# Patient Record
Sex: Female | Born: 1990 | Race: White | Hispanic: No | Marital: Single | Smoking: Never smoker
Health system: Southern US, Community
[De-identification: ages and names within clinical notes are randomized; demographics above are authoritative.]

## PROBLEM LIST (undated history)

## (undated) DIAGNOSIS — Z789 Other specified health status: Secondary | ICD-10-CM

## (undated) HISTORY — PX: NO PAST SURGERIES: SHX2092

---
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
## 2014-12-04 ENCOUNTER — Ambulatory Visit (INDEPENDENT_AMBULATORY_CARE_PROVIDER_SITE_OTHER): Payer: BC Managed Care – PPO | Admitting: Obstetrics and Gynecology

## 2014-12-04 ENCOUNTER — Encounter: Payer: Self-pay | Admitting: Obstetrics and Gynecology

## 2014-12-04 VITALS — BP 116/69 | HR 80 | Ht 65.0 in | Wt 162.1 lb

## 2014-12-04 DIAGNOSIS — N926 Irregular menstruation, unspecified: Secondary | ICD-10-CM

## 2014-12-04 DIAGNOSIS — E663 Overweight: Secondary | ICD-10-CM

## 2014-12-04 LAB — POCT URINE PREGNANCY: PREG TEST UR: POSITIVE — AB

## 2014-12-04 NOTE — Progress Notes (Signed)
Subjective:     Patient ID: Erica Gibson, female   DOB: 02/05/1991, 24 y.o.   MRN: [id number]  HPI Here for pregnancy confirmation  Review of Systems Missed menses with LMP 10/05/14, EGA [redacted]w[redacted]d, with EDC 07/12/15, denies nausea, just feeling tired.    Objective:   Physical Exam A&O x4 Well groomed female in no distress  PE not indicated.     Assessment:     Early pregnancy Overweight     Plan:     Viability scan & Nurse intake within next week and then to see me at 11 weeks for NOB PE  Melody Ines Bloomer, CNM

## 2014-12-05 ENCOUNTER — Other Ambulatory Visit: Payer: BC Managed Care – PPO

## 2014-12-05 DIAGNOSIS — N926 Irregular menstruation, unspecified: Secondary | ICD-10-CM

## 2014-12-05 DIAGNOSIS — Z3491 Encounter for supervision of normal pregnancy, unspecified, first trimester: Secondary | ICD-10-CM

## 2014-12-05 LAB — OB RESULTS CONSOLE GC/CHLAMYDIA
CHLAMYDIA, DNA PROBE: NEGATIVE
Gonorrhea: NEGATIVE

## 2014-12-05 LAB — OB RESULTS CONSOLE RUBELLA ANTIBODY, IGM: RUBELLA: IMMUNE

## 2014-12-05 LAB — OB RESULTS CONSOLE VARICELLA ZOSTER ANTIBODY, IGG: VARICELLA IGG: IMMUNE

## 2014-12-05 LAB — OB RESULTS CONSOLE ABO/RH: RH TYPE: POSITIVE

## 2014-12-05 LAB — OB RESULTS CONSOLE HIV ANTIBODY (ROUTINE TESTING): HIV: NONREACTIVE

## 2014-12-06 LAB — VARICELLA ZOSTER ANTIBODY, IGG: VARICELLA: 234 {index} (ref >165)

## 2014-12-06 LAB — CBC WITH DIFFERENTIAL/PLATELET
Basophils Absolute: 0 10*3/uL (ref 0.0–0.2)
Basos: 0 %
EOS (ABSOLUTE): 0.1 10*3/uL (ref 0.0–0.4)
EOS: 1 %
HEMOGLOBIN: 13 g/dL (ref 11.1–15.9)
Hematocrit: 38.3 % (ref 34.0–46.6)
Immature Grans (Abs): 0 10*3/uL (ref 0.0–0.1)
Immature Granulocytes: 0 %
LYMPHS: 33 %
Lymphocytes Absolute: 1.8 10*3/uL (ref 0.7–3.1)
MCH: 30.5 pg (ref 26.6–33.0)
MCHC: 33.9 g/dL (ref 31.5–35.7)
MCV: 90 fL (ref 79–97)
MONOCYTES: 7 %
MONOS ABS: 0.4 10*3/uL (ref 0.1–0.9)
Neutrophils Absolute: 3.3 10*3/uL (ref 1.4–7.0)
Neutrophils: 59 %
PLATELETS: 198 10*3/uL (ref 150–379)
RBC: 4.26 x10E6/uL (ref 3.77–5.28)
RDW: 12.8 % (ref 12.3–15.4)
WBC: 5.5 10*3/uL (ref 3.4–10.8)

## 2014-12-06 LAB — ANTIBODY SCREEN: Antibody Screen: NEGATIVE

## 2014-12-06 LAB — HEP, RPR, HIV PANEL
HEP B S AG: NEGATIVE
HIV Screen 4th Generation wRfx: NONREACTIVE
RPR: NONREACTIVE

## 2014-12-06 LAB — RUBELLA SCREEN: Rubella Antibodies, IGG: 3.88 index (ref >0.99)

## 2014-12-06 LAB — ABO AND RH: RH TYPE: POSITIVE

## 2014-12-09 ENCOUNTER — Other Ambulatory Visit: Payer: BC Managed Care – PPO

## 2014-12-25 ENCOUNTER — Encounter: Payer: Self-pay | Admitting: Obstetrics and Gynecology

## 2014-12-25 ENCOUNTER — Ambulatory Visit (INDEPENDENT_AMBULATORY_CARE_PROVIDER_SITE_OTHER): Payer: BC Managed Care – PPO | Admitting: Obstetrics and Gynecology

## 2014-12-25 VITALS — BP 115/74 | HR 112 | Wt 161.5 lb

## 2014-12-25 DIAGNOSIS — Z331 Pregnant state, incidental: Secondary | ICD-10-CM

## 2014-12-25 LAB — POCT URINALYSIS DIPSTICK
Blood, UA: NEGATIVE
GLUCOSE UA: NEGATIVE
KETONES UA: NEGATIVE
LEUKOCYTES UA: NEGATIVE
NITRITE UA: NEGATIVE
Spec Grav, UA: 1.015
Urobilinogen, UA: 0.2
pH, UA: 6.5

## 2014-12-25 NOTE — Patient Instructions (Signed)
Second Trimester of Pregnancy The second trimester is from week 13 through week 28, months 4 through 6. The second trimester is often a time when you feel your best. Your body has also adjusted to being pregnant, and you begin to feel better physically. Usually, morning sickness has lessened or quit completely, you may have more energy, and you may have an increase in appetite. The second trimester is also a time when the fetus is growing rapidly. At the end of the sixth month, the fetus is about 9 inches long and weighs about 1 pounds. You will likely begin to feel the baby move (quickening) between 18 and 20 weeks of the pregnancy. BODY CHANGES Your body goes through many changes during pregnancy. The changes vary from woman to woman.   Your weight will continue to increase. You will notice your lower abdomen bulging out.  You may begin to get stretch marks on your hips, abdomen, and breasts.  You may develop headaches that can be relieved by medicines approved by your health care provider.  You may urinate more often because the fetus is pressing on your bladder.  You may develop or continue to have heartburn as a result of your pregnancy.  You may develop constipation because certain hormones are causing the muscles that push waste through your intestines to slow down.  You may develop hemorrhoids or swollen, bulging veins (varicose veins).  You may have back pain because of the weight gain and pregnancy hormones relaxing your joints between the bones in your pelvis and as a result of a shift in weight and the muscles that support your balance.  Your breasts will continue to grow and be tender.  Your gums may bleed and may be sensitive to brushing and flossing.  Dark spots or blotches (chloasma, mask of pregnancy) may develop on your face. This will likely fade after the baby is born.  A dark line from your belly button to the pubic area (linea nigra) may appear. This will likely fade  after the baby is born.  You may have changes in your hair. These can include thickening of your hair, rapid growth, and changes in texture. Some women also have hair loss during or after pregnancy, or hair that feels dry or thin. Your hair will most likely return to normal after your baby is born. WHAT TO EXPECT AT YOUR PRENATAL VISITS During a routine prenatal visit:  You will be weighed to make sure you and the fetus are growing normally.  Your blood pressure will be taken.  Your abdomen will be measured to track your baby's growth.  The fetal heartbeat will be listened to.  Any test results from the previous visit will be discussed. Your health care provider may ask you:  How you are feeling.  If you are feeling the baby move.  If you have had any abnormal symptoms, such as leaking fluid, bleeding, severe headaches, or abdominal cramping.  If you have any questions. Other tests that may be performed during your second trimester include:  Blood tests that check for:  Low iron levels (anemia).  Gestational diabetes (between 24 and 28 weeks).  Rh antibodies.  Urine tests to check for infections, diabetes, or protein in the urine.  An ultrasound to confirm the proper growth and development of the baby.  An amniocentesis to check for possible genetic problems.  Fetal screens for spina bifida and Down syndrome. HOME CARE INSTRUCTIONS   Avoid all smoking, herbs, alcohol, and unprescribed   drugs. These chemicals affect the formation and growth of the baby.  Follow your health care provider's instructions regarding medicine use. There are medicines that are either safe or unsafe to take during pregnancy.  Exercise only as directed by your health care provider. Experiencing uterine cramps is a good sign to stop exercising.  Continue to eat regular, healthy meals.  Wear a good support bra for breast tenderness.  Do not use hot tubs, steam rooms, or saunas.  Wear your  seat belt at all times when driving.  Avoid raw meat, uncooked cheese, cat litter boxes, and soil used by cats. These carry germs that can cause birth defects in the baby.  Take your prenatal vitamins.  Try taking a stool softener (if your health care provider approves) if you develop constipation. Eat more high-fiber foods, such as fresh vegetables or fruit and whole grains. Drink plenty of fluids to keep your urine clear or pale yellow.  Take warm sitz baths to soothe any pain or discomfort caused by hemorrhoids. Use hemorrhoid cream if your health care provider approves.  If you develop varicose veins, wear support hose. Elevate your feet for 15 minutes, 3-4 times a day. Limit salt in your diet.  Avoid heavy lifting, wear low heel shoes, and practice good posture.  Rest with your legs elevated if you have leg cramps or low back pain.  Visit your dentist if you have not gone yet during your pregnancy. Use a soft toothbrush to brush your teeth and be gentle when you floss.  A sexual relationship may be continued unless your health care provider directs you otherwise.  Continue to go to all your prenatal visits as directed by your health care provider. SEEK MEDICAL CARE IF:   You have dizziness.  You have mild pelvic cramps, pelvic pressure, or nagging pain in the abdominal area.  You have persistent nausea, vomiting, or diarrhea.  You have a bad smelling vaginal discharge.  You have pain with urination. SEEK IMMEDIATE MEDICAL CARE IF:   You have a fever.  You are leaking fluid from your vagina.  You have spotting or bleeding from your vagina.  You have severe abdominal cramping or pain.  You have rapid weight gain or loss.  You have shortness of breath with chest pain.  You notice sudden or extreme swelling of your face, hands, ankles, feet, or legs.  You have not felt your baby move in over an hour.  You have severe headaches that do not go away with  medicine.  You have vision changes. Document Released: 04/05/2001 Document Revised: 04/16/2013 Document Reviewed: 06/12/2012 ExitCare Patient Information 2015 [employer]. This information is not intended to replace advice given to you by your health care provider. Make sure you discuss any questions you have with your health care provider.  

## 2014-12-25 NOTE — Progress Notes (Signed)
NEW OB HISTORY AND PHYSICAL  SUBJECTIVE:       Erica Gibson is a 24 y.o. G1P0 female, Patient's last menstrual period was 10/05/2014 (exact date)., Estimated Date of Delivery: 07/12/15, [redacted]w[redacted]d, presents today for establishment of Prenatal Care. She has no unusual complaints and complains of fatigue      Gynecologic History Patient's last menstrual period was 10/05/2014 (exact date). Normal Contraception: none Last Pap: 2015. Results were: normal  Obstetric History OB History  Gravida Para Term Preterm AB SAB TAB Ectopic Multiple Living  1             # Outcome Date GA Lbr Len/2nd Weight Sex Delivery Anes PTL Lv  1 Current               History reviewed. No pertinent past medical history.  History reviewed. No pertinent past surgical history.  Current Outpatient Prescriptions on File Prior to Visit  Medication Sig Dispense Refill  . Prenatal Vit-Fe Fumarate-FA (PRENATAL MULTIVITAMIN) TABS tablet Take 1 tablet by mouth daily at 12 noon.     No current facility-administered medications on file prior to visit.    No Known Allergies  Social History   Social History  . Marital Status: Single    Spouse Name: N/A  . Number of Children: N/A  . Years of Education: N/A   Occupational History  . Not on file.   Social History Main Topics  . Smoking status: Never Smoker   . Smokeless tobacco: Never Used  . Alcohol Use: No  . Drug Use: No  . Sexual Activity: Yes    Birth Control/ Protection: None   Other Topics Concern  . Not on file   Social History Narrative    Family History  Problem Relation Age of Onset  . Hyperlipidemia Father     The following portions of the patient's history were reviewed and updated as appropriate: allergies, current medications, past OB history, past medical history, past surgical history, past family history, past social history, and problem list.    OBJECTIVE: Initial Physical Exam (New OB)  GENERAL APPEARANCE: alert, well  appearing, in no apparent distress, oriented to person, place and time HEAD: normocephalic, atraumatic MOUTH: mucous membranes moist, pharynx normal without lesions THYROID: no thyromegaly or masses present BREASTS: no masses noted, no significant tenderness, no palpable axillary nodes, no skin changes LUNGS: clear to auscultation, no wheezes, rales or rhonchi, symmetric air entry HEART: regular rate and rhythm, no murmurs ABDOMEN: soft, nontender, nondistended, no abnormal masses, no epigastric pain EXTREMITIES: no redness or tenderness in the calves or thighs SKIN: normal coloration and turgor, no rashes LYMPH NODES: no adenopathy palpable NEUROLOGIC: alert, oriented, normal speech, no focal findings or movement disorder noted  PELVIC EXAM FH 12 weeks, fundus + FHR  ASSESSMENT: Normal pregnancy  PLAN: Prenatal care See orders [facility] obtained

## 2014-12-25 NOTE — Progress Notes (Signed)
NOB-c/o acne on her forehead, no other complaints

## 2014-12-31 ENCOUNTER — Encounter: Payer: Self-pay | Admitting: Obstetrics and Gynecology

## 2015-01-01 ENCOUNTER — Other Ambulatory Visit: Payer: Self-pay | Admitting: Obstetrics and Gynecology

## 2015-01-09 ENCOUNTER — Encounter: Payer: Self-pay | Admitting: Obstetrics and Gynecology

## 2015-01-20 ENCOUNTER — Encounter: Payer: Self-pay | Admitting: Obstetrics and Gynecology

## 2015-01-20 ENCOUNTER — Ambulatory Visit (INDEPENDENT_AMBULATORY_CARE_PROVIDER_SITE_OTHER): Payer: BC Managed Care – PPO | Admitting: Obstetrics and Gynecology

## 2015-01-20 VITALS — BP 117/72 | HR 105 | Wt 169.5 lb

## 2015-01-20 DIAGNOSIS — Z3492 Encounter for supervision of normal pregnancy, unspecified, second trimester: Secondary | ICD-10-CM

## 2015-01-20 LAB — POCT URINALYSIS DIPSTICK
Bilirubin, UA: NEGATIVE
GLUCOSE UA: NEGATIVE
Ketones, UA: NEGATIVE
NITRITE UA: NEGATIVE
PH UA: 6.5
PROTEIN UA: NEGATIVE
RBC UA: NEGATIVE
Spec Grav, UA: 1.01
UROBILINOGEN UA: 0.2

## 2015-01-20 MED ORDER — INFLUENZA VAC SPLIT QUAD 0.5 ML IM SUSY
0.5000 mL | PREFILLED_SYRINGE | Freq: Once | INTRAMUSCULAR | Status: AC
  Administered 2015-01-20: 0.5 mL via INTRAMUSCULAR

## 2015-01-20 NOTE — Progress Notes (Signed)
ROB- doing well, anatomy scan next visit 

## 2015-01-20 NOTE — Progress Notes (Signed)
ROB- c/o acne on her face, states its not getting any better

## 2015-01-20 NOTE — Patient Instructions (Signed)
Second Trimester of Pregnancy The second trimester is from week 13 through week 28, months 4 through 6. The second trimester is often a time when you feel your best. Your body has also adjusted to being pregnant, and you begin to feel better physically. Usually, morning sickness has lessened or quit completely, you may have more energy, and you may have an increase in appetite. The second trimester is also a time when the fetus is growing rapidly. At the end of the sixth month, the fetus is about 9 inches long and weighs about 1 pounds. You will likely begin to feel the baby move (quickening) between 18 and 20 weeks of the pregnancy. BODY CHANGES Your body goes through many changes during pregnancy. The changes vary from woman to woman.   Your weight will continue to increase. You will notice your lower abdomen bulging out.  You may begin to get stretch marks on your hips, abdomen, and breasts.  You may develop headaches that can be relieved by medicines approved by your health care provider.  You may urinate more often because the fetus is pressing on your bladder.  You may develop or continue to have heartburn as a result of your pregnancy.  You may develop constipation because certain hormones are causing the muscles that push waste through your intestines to slow down.  You may develop hemorrhoids or swollen, bulging veins (varicose veins).  You may have back pain because of the weight gain and pregnancy hormones relaxing your joints between the bones in your pelvis and as a result of a shift in weight and the muscles that support your balance.  Your breasts will continue to grow and be tender.  Your gums may bleed and may be sensitive to brushing and flossing.  Dark spots or blotches (chloasma, mask of pregnancy) may develop on your face. This will likely fade after the baby is born.  A dark line from your belly button to the pubic area (linea nigra) may appear. This will likely fade  after the baby is born.  You may have changes in your hair. These can include thickening of your hair, rapid growth, and changes in texture. Some women also have hair loss during or after pregnancy, or hair that feels dry or thin. Your hair will most likely return to normal after your baby is born. WHAT TO EXPECT AT YOUR PRENATAL VISITS During a routine prenatal visit:  You will be weighed to make sure you and the fetus are growing normally.  Your blood pressure will be taken.  Your abdomen will be measured to track your baby's growth.  The fetal heartbeat will be listened to.  Any test results from the previous visit will be discussed. Your health care provider may ask you:  How you are feeling.  If you are feeling the baby move.  If you have had any abnormal symptoms, such as leaking fluid, bleeding, severe headaches, or abdominal cramping.  If you have any questions. Other tests that may be performed during your second trimester include:  Blood tests that check for:  Low iron levels (anemia).  Gestational diabetes (between 24 and 28 weeks).  Rh antibodies.  Urine tests to check for infections, diabetes, or protein in the urine.  An ultrasound to confirm the proper growth and development of the baby.  An amniocentesis to check for possible genetic problems.  Fetal screens for spina bifida and Down syndrome. HOME CARE INSTRUCTIONS   Avoid all smoking, herbs, alcohol, and unprescribed   drugs. These chemicals affect the formation and growth of the baby.  Follow your health care provider's instructions regarding medicine use. There are medicines that are either safe or unsafe to take during pregnancy.  Exercise only as directed by your health care provider. Experiencing uterine cramps is a good sign to stop exercising.  Continue to eat regular, healthy meals.  Wear a good support bra for breast tenderness.  Do not use hot tubs, steam rooms, or saunas.  Wear your  seat belt at all times when driving.  Avoid raw meat, uncooked cheese, cat litter boxes, and soil used by cats. These carry germs that can cause birth defects in the baby.  Take your prenatal vitamins.  Try taking a stool softener (if your health care provider approves) if you develop constipation. Eat more high-fiber foods, such as fresh vegetables or fruit and whole grains. Drink plenty of fluids to keep your urine clear or pale yellow.  Take warm sitz baths to soothe any pain or discomfort caused by hemorrhoids. Use hemorrhoid cream if your health care provider approves.  If you develop varicose veins, wear support hose. Elevate your feet for 15 minutes, 3-4 times a day. Limit salt in your diet.  Avoid heavy lifting, wear low heel shoes, and practice good posture.  Rest with your legs elevated if you have leg cramps or low back pain.  Visit your dentist if you have not gone yet during your pregnancy. Use a soft toothbrush to brush your teeth and be gentle when you floss.  A sexual relationship may be continued unless your health care provider directs you otherwise.  Continue to go to all your prenatal visits as directed by your health care provider. SEEK MEDICAL CARE IF:   You have dizziness.  You have mild pelvic cramps, pelvic pressure, or nagging pain in the abdominal area.  You have persistent nausea, vomiting, or diarrhea.  You have a bad smelling vaginal discharge.  You have pain with urination. SEEK IMMEDIATE MEDICAL CARE IF:   You have a fever.  You are leaking fluid from your vagina.  You have spotting or bleeding from your vagina.  You have severe abdominal cramping or pain.  You have rapid weight gain or loss.  You have shortness of breath with chest pain.  You notice sudden or extreme swelling of your face, hands, ankles, feet, or legs.  You have not felt your baby move in over an hour.  You have severe headaches that do not go away with  medicine.  You have vision changes. Document Released: 04/05/2001 Document Revised: 04/16/2013 Document Reviewed: 06/12/2012 ExitCare Patient Information 2015 [employer]. This information is not intended to replace advice given to you by your health care provider. Make sure you discuss any questions you have with your health care provider.  

## 2015-02-20 ENCOUNTER — Ambulatory Visit (INDEPENDENT_AMBULATORY_CARE_PROVIDER_SITE_OTHER): Payer: BC Managed Care – PPO | Admitting: Obstetrics and Gynecology

## 2015-02-20 ENCOUNTER — Encounter: Payer: Self-pay | Admitting: Obstetrics and Gynecology

## 2015-02-20 ENCOUNTER — Ambulatory Visit: Payer: BC Managed Care – PPO

## 2015-02-20 VITALS — BP 100/62 | HR 95 | Wt 178.3 lb

## 2015-02-20 DIAGNOSIS — Z3492 Encounter for supervision of normal pregnancy, unspecified, second trimester: Secondary | ICD-10-CM

## 2015-02-20 LAB — POCT URINALYSIS DIPSTICK
BILIRUBIN UA: NEGATIVE
GLUCOSE UA: NEGATIVE
Ketones, UA: NEGATIVE
LEUKOCYTES UA: NEGATIVE
NITRITE UA: NEGATIVE
PH UA: 6
PROTEIN UA: NEGATIVE
RBC UA: NEGATIVE
Spec Grav, UA: 1.01
UROBILINOGEN UA: 0.2

## 2015-02-20 NOTE — Progress Notes (Signed)
ROB- pt is feeling good, no complaints 

## 2015-02-20 NOTE — Progress Notes (Signed)
Indications:  Anatomy Findings:  Singleton intrauterine pregnancy is visualized with FHR at 153 BPM. Biometrics give an (U/S) Gestational age of [redacted] weeks and 3 days, and an (U/S) EDD of 07/14/15; this correlates with the clinically established EDD of 07/12/15.  Fetal presentation is breech, spine anterior.  EFW: 303 grams ( 0 lbs. 11 oz. ). Placenta: Posterior, grade 1 and remote to cervix. AFI: Adequate, MVP 3.8 cm.   Anatomic survey is complete and appears  normal; Gender - female .   Right and left ovary were not visualized.  There is no evidence of a corpus luteal cyst. Survey of the adnexa demonstrates no adnexal masses. There is no free peritoneal fluid in the cul de sac.  Impression: 1. 19 week 3 day Viable Singleton Intrauterine pregnancy by U/S. 2. (U/S) EDD is consistent with Clinically established (LMP) EDD of 07/12/15. 3. Normal Anatomy Scan

## 2015-03-25 ENCOUNTER — Ambulatory Visit (INDEPENDENT_AMBULATORY_CARE_PROVIDER_SITE_OTHER): Payer: BC Managed Care – PPO | Admitting: Obstetrics and Gynecology

## 2015-03-25 ENCOUNTER — Encounter: Payer: Self-pay | Admitting: Obstetrics and Gynecology

## 2015-03-25 VITALS — BP 114/75 | HR 89 | Wt 177.7 lb

## 2015-03-25 DIAGNOSIS — Z3493 Encounter for supervision of normal pregnancy, unspecified, third trimester: Secondary | ICD-10-CM

## 2015-03-25 LAB — POCT URINALYSIS DIPSTICK
Bilirubin, UA: NEGATIVE
Glucose, UA: NEGATIVE
Ketones, UA: NEGATIVE
Nitrite, UA: NEGATIVE
Protein, UA: NEGATIVE
RBC UA: NEGATIVE
SPEC GRAV UA: 1.015
UROBILINOGEN UA: 0.2
pH, UA: 7

## 2015-03-25 NOTE — Progress Notes (Signed)
ROB-pt denies any complaints 

## 2015-03-25 NOTE — Progress Notes (Signed)
ROB- doing well, encouraged enrolling in CBC, ICC, & BFC- schedule given, glucola next visit.

## 2015-04-21 ENCOUNTER — Other Ambulatory Visit: Payer: Self-pay | Admitting: *Deleted

## 2015-04-21 ENCOUNTER — Encounter: Payer: Self-pay | Admitting: Obstetrics and Gynecology

## 2015-04-21 ENCOUNTER — Ambulatory Visit (INDEPENDENT_AMBULATORY_CARE_PROVIDER_SITE_OTHER): Payer: BC Managed Care – PPO | Admitting: Obstetrics and Gynecology

## 2015-04-21 VITALS — BP 99/70 | HR 100 | Wt 180.4 lb

## 2015-04-21 DIAGNOSIS — Z3493 Encounter for supervision of normal pregnancy, unspecified, third trimester: Secondary | ICD-10-CM

## 2015-04-21 DIAGNOSIS — Z23 Encounter for immunization: Secondary | ICD-10-CM

## 2015-04-21 DIAGNOSIS — Z131 Encounter for screening for diabetes mellitus: Secondary | ICD-10-CM

## 2015-04-21 LAB — POCT URINALYSIS DIPSTICK
BILIRUBIN UA: NEGATIVE
Blood, UA: NEGATIVE
GLUCOSE UA: NEGATIVE
KETONES UA: 40
Nitrite, UA: NEGATIVE
SPEC GRAV UA: 1.015
Urobilinogen, UA: 0.2
pH, UA: 6

## 2015-04-21 MED ORDER — TETANUS-DIPHTH-ACELL PERTUSSIS 5-2.5-18.5 LF-MCG/0.5 IM SUSP
0.5000 mL | Freq: Once | INTRAMUSCULAR | Status: AC
  Administered 2015-04-21: 0.5 mL via INTRAMUSCULAR

## 2015-04-21 NOTE — Progress Notes (Signed)
ROB & glucola-enrolled in classes, no concerns, blood consent signed,

## 2015-04-21 NOTE — Patient Instructions (Signed)

## 2015-04-21 NOTE — Progress Notes (Signed)
ROB-blood consent signed, glucola done, tdap given States she is having some nasal congestion, hasnt used anything OTC

## 2015-04-22 ENCOUNTER — Telehealth: Payer: Self-pay | Admitting: Obstetrics and Gynecology

## 2015-04-22 ENCOUNTER — Other Ambulatory Visit: Payer: Self-pay | Admitting: Obstetrics and Gynecology

## 2015-04-22 DIAGNOSIS — O99810 Abnormal glucose complicating pregnancy: Secondary | ICD-10-CM

## 2015-04-22 LAB — GLUCOSE, 1 HOUR GESTATIONAL: Gestational Diabetes Screen: 158 mg/dL — ABNORMAL HIGH (ref 65–139)

## 2015-04-22 LAB — HEMOGLOBIN AND HEMATOCRIT, BLOOD
HEMATOCRIT: 35.3 % (ref 34.0–46.6)
Hemoglobin: 12 g/dL (ref 11.1–15.9)

## 2015-04-22 NOTE — Telephone Encounter (Signed)
Notified pt of results, she is going to call us 04/23/15 and let us know

## 2015-04-22 NOTE — Telephone Encounter (Signed)
PT CALLED AND SHE WAS HERE YESTERDAY FOR HER GLUCOSE AND SHE WANTED TO KNOW THE RESULTS IF THEY WERE BACK.

## 2015-04-22 NOTE — Telephone Encounter (Signed)
Notified pt of labs she will contact us about 3 hr

## 2015-04-22 NOTE — Telephone Encounter (Signed)
-----   Message from [place]res[place]ody N [place], [place] sent at 04/22/2015  8:47 AM EST ----- Please let her know she failed her 1h GTT, needs to come in within the next week for the three hour fasting. No signs of anemia

## 2015-04-24 ENCOUNTER — Other Ambulatory Visit: Payer: Self-pay | Admitting: Obstetrics and Gynecology

## 2015-04-24 ENCOUNTER — Other Ambulatory Visit: Payer: BC Managed Care – PPO

## 2015-04-24 DIAGNOSIS — O99810 Abnormal glucose complicating pregnancy: Secondary | ICD-10-CM

## 2015-04-25 LAB — GESTATIONAL GLUCOSE TOLERANCE
Glucose, Fasting: 73 mg/dL (ref 65–94)
Glucose, GTT - 1 Hour: 186 mg/dL — ABNORMAL HIGH (ref 65–179)
Glucose, GTT - 2 Hour: 150 mg/dL (ref 65–154)
Glucose, GTT - 3 Hour: 144 mg/dL — ABNORMAL HIGH (ref 65–139)

## 2015-04-26 NOTE — L&D Delivery Note (Cosign Needed)
Delivery Note At  a viable and healthy female "Erica Gibson"was delivered via  (PresentationROA: ;  ).  APGAR:8  9, ; weight  .   Placenta status: delivered intact with 3 vessel  Cord:  with the following complications none:   Anesthesia:  epidural Episiotomy:  none Lacerations:  2nd degree Suture Repair: 3.0 vicryl rapide Est. Blood Loss (mL):  300  Mom to postpartum.  Baby to Couplet care / Skin to Skin.  Miriana Gaertner NIKE Jia Mohamed, CNM 07/08/2015, 7:46 PM

## 2015-04-28 ENCOUNTER — Telehealth: Payer: Self-pay | Admitting: *Deleted

## 2015-04-28 ENCOUNTER — Other Ambulatory Visit: Payer: Self-pay | Admitting: Obstetrics and Gynecology

## 2015-04-28 DIAGNOSIS — O24419 Gestational diabetes mellitus in pregnancy, unspecified control: Secondary | ICD-10-CM

## 2015-04-28 NOTE — Telephone Encounter (Signed)
Notified pt of results 

## 2015-04-28 NOTE — Telephone Encounter (Signed)
-----   Message from [place] Nails[place]ody N Sham[place], [place] sent at 04/28/2015  8:34 AM EST ----- Please let her know she has gestational diabetes- I have placed lifestyle referral. Please send info on GDM for her to review in mean time.

## 2015-05-05 ENCOUNTER — Encounter: Payer: BC Managed Care – PPO | Attending: Obstetrics and Gynecology | Admitting: Dietician

## 2015-05-05 ENCOUNTER — Encounter: Payer: Self-pay | Admitting: Dietician

## 2015-05-05 VITALS — BP 96/60 | Ht 66.0 in | Wt 181.5 lb

## 2015-05-05 DIAGNOSIS — O24419 Gestational diabetes mellitus in pregnancy, unspecified control: Secondary | ICD-10-CM | POA: Insufficient documentation

## 2015-05-05 DIAGNOSIS — O24410 Gestational diabetes mellitus in pregnancy, diet controlled: Secondary | ICD-10-CM

## 2015-05-05 NOTE — Patient Instructions (Signed)
Read booklet on Gestational Diabetes Follow Gestational Meal Planning Guidelines Complete a 3 Day Food Record and bring to next appointment Check blood sugars 4 x day - before breakfast and 2 hrs after every meal and record  Bring blood sugar log to all appointments Call MD for prescription for meter strips and lancets Strips:   One Touch Verio  Lancets: One Touch Delica Walk 20-30 minutes at least 5 x week if permitted by MD Next appointment    05-13-15 Call if any questions [phone]

## 2015-05-05 NOTE — Progress Notes (Signed)
Diabetes Self-Management Education  Visit Type: First/Initial  Appt. Start Time: 1300 Appt. End Time: 1430  05/05/2015  [name], identified by name and date of birth, is a 25 y.o. female with a diagnosis of Diabetes: Gestational Diabetes.   ASSESSMENT  Blood pressure 96/60, height 5\' 6"  (1.676 m), weight 181 lb 8 oz (82.328 kg), last menstrual period 10/05/2014. Body mass index is 29.31 kg/(m^2).  Lacks knowledge of diabetes care and diet Is not testing BG's-has no meter      Diabetes Self-Management Education - 05/05/15 1434    Visit Information   Visit Type First/Initial   Initial Visit   Diabetes Type Gestational Diabetes   Health Coping   How would you rate your overall health? Good   Psychosocial Assessment   Patient Belief/Attitude about Diabetes Motivated to manage diabetes   Self-care barriers --  works rotating shifts as Engineer, civil (consulting) at [employer]   Patient Concerns Glycemic Control;Healthy Lifestyle  prevent complications; become more fit   Special Needs None   Preferred Learning Style Hands on   Learning Readiness Ready   What is the last grade level you completed in school? 16   Complications   How often do you check your blood sugar? 0 times/day (not testing)   Have you had a dilated eye exam in the past 12 months? No   Have you had a dental exam in the past 12 months? Yes  2016   Are you checking your feet? No   Dietary Intake   Breakfast --  eats 3 meals/day; meal times vary (works rotating day/night shifts as nurse)-eats breakfast 6-8am   Snack (morning) --  eats occasional 10-11 am snack=yogurt   Lunch --  eats lunch at 12-3pm=eats fried foods and sweets 2-3x/wk and snack foods 4-5x/wk.   Snack (afternoon) --  none   Dinner --  eats supper at [employer] (evening) --  none   Beverage(s) --  has recently decreased intake of regular sodas; drinks 6-7 glasses of water daily; drinks orange juice 1x/day  and occasional milk 1x/day   Exercise   Exercise Type ADL's  occasionally walks   Patient Education   Previous Diabetes Education --  pt is a nurse-in nurses training   Disease state  --  discussed GDM and importance of tight BG control to liwer risk of complications with pregnancy   Nutrition management  Role of diet in the treatment of diabetes and the relationship between the three main macronutrients and blood glucose level;Food label reading, portion sizes and measuring food.;Carbohydrate counting   Physical activity and exercise  --  discussed importance of regular exercise to promote BG control-encouraged to walk daily if able and permitted by MD   Medications --  discussed options of medications for treating GDM    Monitoring Taught/evaluated SMBG meter.;Purpose and frequency of SMBG.;Taught/discussed recording of test results and interpretation of SMBG.;Ketone testing, when, how.  gave pt One Touch Verio meter and instructed on its use-BG 71 ac lunch   Psychosocial adjustment Role of stress on diabetes   Preconception care Pregnancy and GDM  Role of pre-pregnancy blood glucose control on the development of the fetus;Role of family planning for patients with diabetes;Reviewed with patient blood glucose goals with pregnancy   Personal strategies to promote health Lifestyle issues that need to be addressed for better diabetes care;Helped patient develop diabetes management plan for (enter comment)      Individualized Plan for Diabetes Self-Management Training:   Learning Objective:  Patient will have a greater understanding of diabetes self-management. Patient education plan is to attend individual and/or group sessions per assessed needs and concerns.   Plan:   Patient Instructions  Read booklet on Gestational Diabetes Follow Gestational Meal Planning Guidelines Complete a 3 Day Food Record and bring to next appointment Check blood sugars 4 x day - before breakfast and 2 hrs after every meal and record  Bring blood  sugar log to all appointments Call MD for prescription for meter strips and lancets Strips:   One Touch Verio  Lancets: One Touch Delica Walk 20-30 minutes at least 5 x week if permitted by MD Next appointment    05-13-15 Call if any questions [phone]   Expected Outcomes:   Positive  Education material provided: General Meal Planning Guidelines for Healthy Pregnancy, One Touch Verio meter, GDM video  If problems or questions, patient to contact team via: [phone]  Future DSME appointment:  05-13-15

## 2015-05-07 ENCOUNTER — Ambulatory Visit (INDEPENDENT_AMBULATORY_CARE_PROVIDER_SITE_OTHER): Payer: BC Managed Care – PPO | Admitting: Obstetrics and Gynecology

## 2015-05-07 ENCOUNTER — Encounter: Payer: Self-pay | Admitting: Obstetrics and Gynecology

## 2015-05-07 VITALS — BP 105/71 | HR 93 | Wt 180.4 lb

## 2015-05-07 DIAGNOSIS — Z331 Pregnant state, incidental: Secondary | ICD-10-CM

## 2015-05-07 LAB — POCT URINALYSIS DIPSTICK
Bilirubin, UA: NEGATIVE
Blood, UA: NEGATIVE
GLUCOSE UA: NEGATIVE
KETONES UA: NEGATIVE
Nitrite, UA: NEGATIVE
Protein, UA: NEGATIVE
UROBILINOGEN UA: 0.2
pH, UA: 7.5

## 2015-05-07 NOTE — Progress Notes (Signed)
ROB-pt denies any complaints 

## 2015-05-07 NOTE — Progress Notes (Signed)
ROB- doing well, FBS all <98 and PP all < 105, OK to check FBS and 1 PP daily from here on out, growth scan in 4 weeks

## 2015-05-11 ENCOUNTER — Telehealth: Payer: Self-pay | Admitting: Obstetrics and Gynecology

## 2015-05-11 ENCOUNTER — Other Ambulatory Visit: Payer: Self-pay | Admitting: *Deleted

## 2015-05-11 MED ORDER — GLUCOSE BLOOD VI STRP: ORAL_STRIP | Status: DC

## 2015-05-11 NOTE — Telephone Encounter (Signed)
Done-ac 

## 2015-05-11 NOTE — Telephone Encounter (Signed)
SHE NEEDS TEST STRIPS FOR HER GLUCOMETER, ONE TOUCH VERIO  ([facility] [address])

## 2015-05-13 ENCOUNTER — Encounter: Payer: BC Managed Care – PPO | Admitting: Dietician

## 2015-05-13 VITALS — BP 100/70 | Ht 66.0 in | Wt 180.7 lb

## 2015-05-13 DIAGNOSIS — O24410 Gestational diabetes mellitus in pregnancy, diet controlled: Secondary | ICD-10-CM

## 2015-05-13 DIAGNOSIS — O24419 Gestational diabetes mellitus in pregnancy, unspecified control: Secondary | ICD-10-CM | POA: Diagnosis not present

## 2015-05-13 NOTE — Progress Notes (Signed)
   Patient's BG record indicates BGs are within goal ranges.  Patient's food diary indicates healthy food choices, some meals slightly low in carbohydrate. Patient reports easy access to [place] foods, but has been avoiding deli meats. Eats mostly grilled chicken for protein.    Provided 1900kcal meal plan, and wrote individualized menus based on patient's food preferences.  Instructed patient on food safety, including avoidance of Listeriosis, and limiting mercury from fish.  Discussed importance of maintaining healthy lifestyle habits to reduce risk of Type 2 DM as well as Gestational DM with any future pregnancies.  Advised patient to use any remaining testing supplies to test some BGs after delivery, and to have BG tested ideally annually, as well as prior to attempting future pregnancies.

## 2015-05-13 NOTE — Patient Instructions (Signed)
   Continue with healthy food choices, allow for up to 3 carb servings with each meal.  OK to test a small bowl of cereal with fiber, make sure to test BG after that meal.

## 2015-05-19 ENCOUNTER — Encounter: Payer: Self-pay | Admitting: Obstetrics and Gynecology

## 2015-05-19 ENCOUNTER — Ambulatory Visit (INDEPENDENT_AMBULATORY_CARE_PROVIDER_SITE_OTHER): Payer: BC Managed Care – PPO | Admitting: Obstetrics and Gynecology

## 2015-05-19 VITALS — BP 104/63 | HR 99 | Wt 179.8 lb

## 2015-05-19 DIAGNOSIS — Z331 Pregnant state, incidental: Secondary | ICD-10-CM

## 2015-05-19 LAB — POCT URINALYSIS DIPSTICK
BILIRUBIN UA: NEGATIVE
GLUCOSE UA: NEGATIVE
Ketones, UA: NEGATIVE
Nitrite, UA: NEGATIVE
RBC UA: NEGATIVE
SPEC GRAV UA: 1.01
Urobilinogen, UA: 0.2
pH, UA: 7

## 2015-05-19 NOTE — Progress Notes (Signed)
ROB-pt denies any complaints, " FOB wants to know if her nipples are ever gonna look normal again"

## 2015-05-19 NOTE — Progress Notes (Signed)
ROB- doing well, see BS scanned sheet all FBS<95

## 2015-06-04 ENCOUNTER — Ambulatory Visit (INDEPENDENT_AMBULATORY_CARE_PROVIDER_SITE_OTHER): Payer: BC Managed Care – PPO

## 2015-06-04 ENCOUNTER — Encounter: Payer: Self-pay | Admitting: Obstetrics and Gynecology

## 2015-06-04 ENCOUNTER — Ambulatory Visit (INDEPENDENT_AMBULATORY_CARE_PROVIDER_SITE_OTHER): Payer: BC Managed Care – PPO | Admitting: Obstetrics and Gynecology

## 2015-06-04 VITALS — BP 111/73 | HR 94 | Wt 180.2 lb

## 2015-06-04 DIAGNOSIS — Z331 Pregnant state, incidental: Secondary | ICD-10-CM

## 2015-06-04 DIAGNOSIS — Z3493 Encounter for supervision of normal pregnancy, unspecified, third trimester: Secondary | ICD-10-CM | POA: Diagnosis not present

## 2015-06-04 LAB — POCT URINALYSIS DIPSTICK
Bilirubin, UA: NEGATIVE
Blood, UA: NEGATIVE
Glucose, UA: NEGATIVE
Ketones, UA: NEGATIVE
Nitrite, UA: NEGATIVE
PROTEIN UA: NEGATIVE
SPEC GRAV UA: 1.01
UROBILINOGEN UA: 0.2
pH, UA: 7

## 2015-06-04 NOTE — Progress Notes (Signed)
ROB-pt denies any complaints 

## 2015-06-04 NOTE — Patient Instructions (Signed)
Braxton Hicks Contractions °Contractions of the uterus can occur throughout pregnancy. Contractions are not always a sign that you are in labor.  °WHAT ARE BRAXTON HICKS CONTRACTIONS?  °Contractions that occur before labor are called Braxton Hicks contractions, or false labor. Toward the end of pregnancy (32-34 weeks), these contractions can develop more often and may become more forceful. This is not true labor because these contractions do not result in opening (dilatation) and thinning of the cervix. They are sometimes difficult to tell apart from true labor because these contractions can be forceful and people have different pain tolerances. You should not feel embarrassed if you go to the hospital with false labor. Sometimes, the only way to tell if you are in true labor is for your health care provider to look for changes in the cervix. °If there are no prenatal problems or other health problems associated with the pregnancy, it is completely safe to be sent home with false labor and await the onset of true labor. °HOW CAN YOU TELL THE DIFFERENCE BETWEEN TRUE AND FALSE LABOR? °False Labor °· The contractions of false labor are usually shorter and not as hard as those of true labor.   °· The contractions are usually irregular.   °· The contractions are often felt in the front of the lower abdomen and in the groin.   °· The contractions may go away when you walk around or change positions while lying down.   °· The contractions get weaker and are shorter lasting as time goes on.   °· The contractions do not usually become progressively stronger, regular, and closer together as with true labor.   °True Labor °· Contractions in true labor last 30-70 seconds, become very regular, usually become more intense, and increase in frequency.   °· The contractions do not go away with walking.   °· The discomfort is usually felt in the top of the uterus and spreads to the lower abdomen and low back.   °· True labor can be  determined by your health care provider with an exam. This will show that the cervix is dilating and getting thinner.   °WHAT TO REMEMBER °· Keep up with your usual exercises and follow other instructions given by your health care provider.   °· Take medicines as directed by your health care provider.   °· Keep your regular prenatal appointments.   °· Eat and drink lightly if you think you are going into labor.   °· If Braxton Hicks contractions are making you uncomfortable:   °¨ Change your position from lying down or resting to walking, or from walking to resting.   °¨ Sit and rest in a tub of warm water.   °¨ Drink 2-3 glasses of water. Dehydration may cause these contractions.   °¨ Do slow and deep breathing several times an hour.   °WHEN SHOULD I SEEK IMMEDIATE MEDICAL CARE? °Seek immediate medical care if: °· Your contractions become stronger, more regular, and closer together.   °· You have fluid leaking or gushing from your vagina.   °· You have a fever.   °· You pass blood-tinged mucus.   °· You have vaginal bleeding.   °· You have continuous abdominal pain.   °· You have low back pain that you never had before.   °· You feel your baby's head pushing down and causing pelvic pressure.   °· Your baby is not moving as much as it used to.   °  °This information is not intended to replace advice given to you by your health care provider. Make sure you discuss any questions you have with your health care   provider. °  °Document Released: 04/11/2005 Document Revised: 04/16/2013 Document Reviewed: 01/21/2013 °Elsevier Interactive Patient Education ©2016 [employer] ° °

## 2015-06-04 NOTE — Progress Notes (Signed)
Indications:Growth AFI Findings:  Singleton intrauterine pregnancy is visualized with FHR at 150 BPM. Biometrics give an (U/S) Gestational age of 90 3/7 weeks and an (U/S) EDD of 07/06/15; this correlates with the clinically established EDD of 07/12/15.  Fetal presentation is Vertex.  EFW: 2645g (5lb 13oz). Placenta: posterior, grade 1, remote to cervix. AFI: 12.6cm.  Survey of the adnexa demonstrates no adnexal masses. There is no free peritoneal fluid in the cul de sac.  Impression: 1. 35 3/7 week Viable Singleton Intrauterine pregnancy by U/S. 2. (U/S) EDD is consistent with Clinically established (LMP) EDD of 07/12/15. 3. Adequate Growth and AFI

## 2015-06-15 ENCOUNTER — Other Ambulatory Visit: Payer: Self-pay | Admitting: *Deleted

## 2015-06-15 DIAGNOSIS — Z3685 Encounter for antenatal screening for Streptococcus B: Secondary | ICD-10-CM

## 2015-06-15 DIAGNOSIS — Z113 Encounter for screening for infections with a predominantly sexual mode of transmission: Secondary | ICD-10-CM

## 2015-06-15 DIAGNOSIS — Z331 Pregnant state, incidental: Secondary | ICD-10-CM

## 2015-06-16 ENCOUNTER — Ambulatory Visit (INDEPENDENT_AMBULATORY_CARE_PROVIDER_SITE_OTHER): Payer: BC Managed Care – PPO | Admitting: Obstetrics and Gynecology

## 2015-06-16 ENCOUNTER — Encounter: Payer: Self-pay | Admitting: Obstetrics and Gynecology

## 2015-06-16 ENCOUNTER — Other Ambulatory Visit: Payer: Self-pay | Admitting: Obstetrics and Gynecology

## 2015-06-16 VITALS — BP 104/60 | HR 97 | Wt 182.1 lb

## 2015-06-16 DIAGNOSIS — Z331 Pregnant state, incidental: Secondary | ICD-10-CM

## 2015-06-16 LAB — POCT URINALYSIS DIPSTICK
Bilirubin, UA: NEGATIVE
GLUCOSE UA: NEGATIVE
KETONES UA: NEGATIVE
Leukocytes, UA: NEGATIVE
Nitrite, UA: NEGATIVE
PROTEIN UA: NEGATIVE
RBC UA: NEGATIVE
SPEC GRAV UA: 1.01
UROBILINOGEN UA: 0.2
pH, UA: 7

## 2015-06-16 NOTE — Progress Notes (Signed)
ROB- cultures obtained today, pt is having some pelvic pressure, low back pain

## 2015-06-16 NOTE — Progress Notes (Signed)
Rob- bhc DISCUSSED, CULTURES OBTAINED, HAS START OF bfc TONIGHT

## 2015-06-16 NOTE — Patient Instructions (Signed)
Braxton Hicks Contractions °Contractions of the uterus can occur throughout pregnancy. Contractions are not always a sign that you are in labor.  °WHAT ARE BRAXTON HICKS CONTRACTIONS?  °Contractions that occur before labor are called Braxton Hicks contractions, or false labor. Toward the end of pregnancy (32-34 weeks), these contractions can develop more often and may become more forceful. This is not true labor because these contractions do not result in opening (dilatation) and thinning of the cervix. They are sometimes difficult to tell apart from true labor because these contractions can be forceful and people have different pain tolerances. You should not feel embarrassed if you go to the hospital with false labor. Sometimes, the only way to tell if you are in true labor is for your health care provider to look for changes in the cervix. °If there are no prenatal problems or other health problems associated with the pregnancy, it is completely safe to be sent home with false labor and await the onset of true labor. °HOW CAN YOU TELL THE DIFFERENCE BETWEEN TRUE AND FALSE LABOR? °False Labor °· The contractions of false labor are usually shorter and not as hard as those of true labor.   °· The contractions are usually irregular.   °· The contractions are often felt in the front of the lower abdomen and in the groin.   °· The contractions may go away when you walk around or change positions while lying down.   °· The contractions get weaker and are shorter lasting as time goes on.   °· The contractions do not usually become progressively stronger, regular, and closer together as with true labor.   °True Labor °· Contractions in true labor last 30-70 seconds, become very regular, usually become more intense, and increase in frequency.   °· The contractions do not go away with walking.   °· The discomfort is usually felt in the top of the uterus and spreads to the lower abdomen and low back.   °· True labor can be  determined by your health care provider with an exam. This will show that the cervix is dilating and getting thinner.   °WHAT TO REMEMBER °· Keep up with your usual exercises and follow other instructions given by your health care provider.   °· Take medicines as directed by your health care provider.   °· Keep your regular prenatal appointments.   °· Eat and drink lightly if you think you are going into labor.   °· If Braxton Hicks contractions are making you uncomfortable:   °¨ Change your position from lying down or resting to walking, or from walking to resting.   °¨ Sit and rest in a tub of warm water.   °¨ Drink 2-3 glasses of water. Dehydration may cause these contractions.   °¨ Do slow and deep breathing several times an hour.   °WHEN SHOULD I SEEK IMMEDIATE MEDICAL CARE? °Seek immediate medical care if: °· Your contractions become stronger, more regular, and closer together.   °· You have fluid leaking or gushing from your vagina.   °· You have a fever.   °· You pass blood-tinged mucus.   °· You have vaginal bleeding.   °· You have continuous abdominal pain.   °· You have low back pain that you never had before.   °· You feel your baby's head pushing down and causing pelvic pressure.   °· Your baby is not moving as much as it used to.   °  °This information is not intended to replace advice given to you by your health care provider. Make sure you discuss any questions you have with your health care   provider. °  °Document Released: 04/11/2005 Document Revised: 04/16/2013 Document Reviewed: 01/21/2013 °Elsevier Interactive Patient Education ©2016 [employer] ° °

## 2015-06-17 ENCOUNTER — Other Ambulatory Visit: Payer: Self-pay

## 2015-06-17 DIAGNOSIS — Z113 Encounter for screening for infections with a predominantly sexual mode of transmission: Secondary | ICD-10-CM

## 2015-06-17 DIAGNOSIS — Z331 Pregnant state, incidental: Secondary | ICD-10-CM

## 2015-06-17 DIAGNOSIS — Z3685 Encounter for antenatal screening for Streptococcus B: Secondary | ICD-10-CM

## 2015-06-23 LAB — CULTURE, BETA STREP (GROUP B ONLY): STREP GP B CULTURE: NEGATIVE

## 2015-06-23 LAB — GC/CHLAMYDIA PROBE AMP
Chlamydia trachomatis, NAA: NEGATIVE
Neisseria gonorrhoeae by PCR: NEGATIVE

## 2015-06-24 ENCOUNTER — Encounter: Payer: Self-pay | Admitting: Obstetrics and Gynecology

## 2015-06-24 ENCOUNTER — Ambulatory Visit (INDEPENDENT_AMBULATORY_CARE_PROVIDER_SITE_OTHER): Payer: BC Managed Care – PPO | Admitting: Obstetrics and Gynecology

## 2015-06-24 VITALS — BP 107/73 | HR 94 | Wt 184.3 lb

## 2015-06-24 DIAGNOSIS — Z331 Pregnant state, incidental: Secondary | ICD-10-CM

## 2015-06-24 NOTE — Progress Notes (Signed)
ROB- pt is having a lot of pelvic pressure, some contractions 

## 2015-06-24 NOTE — Progress Notes (Signed)
ROB- labor precautions discussed 

## 2015-06-30 ENCOUNTER — Ambulatory Visit (INDEPENDENT_AMBULATORY_CARE_PROVIDER_SITE_OTHER): Payer: BC Managed Care – PPO | Admitting: Obstetrics and Gynecology

## 2015-06-30 ENCOUNTER — Ambulatory Visit (INDEPENDENT_AMBULATORY_CARE_PROVIDER_SITE_OTHER): Payer: BC Managed Care – PPO

## 2015-06-30 ENCOUNTER — Encounter: Payer: Self-pay | Admitting: Obstetrics and Gynecology

## 2015-06-30 VITALS — BP 114/82 | HR 95 | Wt 184.2 lb

## 2015-06-30 DIAGNOSIS — Z331 Pregnant state, incidental: Secondary | ICD-10-CM

## 2015-06-30 DIAGNOSIS — Z3493 Encounter for supervision of normal pregnancy, unspecified, third trimester: Secondary | ICD-10-CM

## 2015-06-30 LAB — POCT URINALYSIS DIPSTICK
BILIRUBIN UA: NEGATIVE
Blood, UA: NEGATIVE
Glucose, UA: NEGATIVE
KETONES UA: NEGATIVE
LEUKOCYTES UA: NEGATIVE
NITRITE UA: NEGATIVE
PH UA: 6
Protein, UA: NEGATIVE
Spec Grav, UA: 1.01
Urobilinogen, UA: 0.2

## 2015-06-30 NOTE — Progress Notes (Signed)
ROB- pt is c/o pelvic pressure,contractions 

## 2015-06-30 NOTE — Progress Notes (Signed)
Indications:Growth and AFI for GDM Findings:  Singleton intrauterine pregnancy is visualized with FHR at 175 BPM. Biometrics give an (U/S) Gestational age of 25 3/7 weeks and an (U/S) EDD of 07/11/15; this correlates with the clinically established EDD of 07/12/15.  Fetal presentation is Vertex.  EFW: 3454 g (7lb 10oz). 61 percentile Placenta: posterior, grade 1, remote to cervix. AFI: 17.2cm.  Survey of the adnexa demonstrates no adnexal masses. There is no free peritoneal fluid in the cul de sac.  Impression: 1. 38 3/7 week Viable Singleton Intrauterine pregnancy by U/S. 2. (U/S) EDD is consistent with Clinically established (LMP) EDD of 07/12/15. 3.Adequate growth and AFI  For IOL on 07/08/15 if not delivered by then.

## 2015-07-07 ENCOUNTER — Inpatient Hospital Stay
Admission: EM | Admit: 2015-07-07 | Discharge: 2015-07-10 | DRG: 775 | Disposition: A | Discharge status: Home / Self Care | Payer: BC Managed Care – PPO | Attending: Obstetrics and Gynecology | Admitting: Obstetrics and Gynecology

## 2015-07-07 ENCOUNTER — Other Ambulatory Visit: Payer: Self-pay | Admitting: Obstetrics and Gynecology

## 2015-07-07 DIAGNOSIS — O24420 Gestational diabetes mellitus in childbirth, diet controlled: Principal | ICD-10-CM | POA: Diagnosis present

## 2015-07-07 DIAGNOSIS — Z3A39 39 weeks gestation of pregnancy: Secondary | ICD-10-CM | POA: Diagnosis not present

## 2015-07-07 DIAGNOSIS — O24419 Gestational diabetes mellitus in pregnancy, unspecified control: Secondary | ICD-10-CM | POA: Diagnosis present

## 2015-07-07 DIAGNOSIS — O24410 Gestational diabetes mellitus in pregnancy, diet controlled: Secondary | ICD-10-CM | POA: Diagnosis present

## 2015-07-07 DIAGNOSIS — Z3403 Encounter for supervision of normal first pregnancy, third trimester: Secondary | ICD-10-CM | POA: Diagnosis not present

## 2015-07-07 LAB — CBC
HCT: 35.7 % (ref 35.0–47.0)
Hemoglobin: 12.6 g/dL (ref 12.0–16.0)
MCH: 32.5 pg (ref 26.0–34.0)
MCHC: 35.2 g/dL (ref 32.0–36.0)
MCV: 92.3 fL (ref 80.0–100.0)
Platelets: 179 10*3/uL (ref 150–440)
RBC: 3.87 MIL/uL (ref 3.80–5.20)
RDW: 12 % (ref 11.5–14.5)
WBC: 10.4 10*3/uL (ref 3.6–11.0)

## 2015-07-07 LAB — ABO/RH: ABO/RH(D): O POS

## 2015-07-07 LAB — GLUCOSE, CAPILLARY: Glucose-Capillary: 114 mg/dL — ABNORMAL HIGH (ref 65–99)

## 2015-07-07 LAB — TYPE AND SCREEN
ABO/RH(D): O POS
ANTIBODY SCREEN: NEGATIVE

## 2015-07-07 MED ORDER — OXYCODONE-ACETAMINOPHEN 5-325 MG PO TABS: 1.0000 | ORAL_TABLET | ORAL | Status: DC | PRN

## 2015-07-07 MED ORDER — OXYTOCIN 40 UNITS IN LACTATED RINGERS INFUSION - SIMPLE MED
2.5000 [IU]/h | INTRAVENOUS | Status: DC
  Administered 2015-07-08: 39.96 [IU]/h via INTRAVENOUS

## 2015-07-07 MED ORDER — MISOPROSTOL 25 MCG QUARTER TABLET
25.0000 ug | ORAL_TABLET | ORAL | Status: DC | PRN
  Administered 2015-07-07: 25 ug via VAGINAL
  Filled 2015-07-07: qty 1
  Filled 2015-07-07: qty 0.25

## 2015-07-07 MED ORDER — ACETAMINOPHEN 325 MG PO TABS
650.0000 mg | ORAL_TABLET | ORAL | Status: DC | PRN
Start: 2015-07-07 — End: 2015-07-08

## 2015-07-07 MED ORDER — CITRIC ACID-SODIUM CITRATE 334-500 MG/5ML PO SOLN: 30.0000 mL | ORAL | Status: DC | PRN

## 2015-07-07 MED ORDER — LIDOCAINE HCL (PF) 1 % IJ SOLN: 30.0000 mL | INTRAMUSCULAR | Status: DC | PRN

## 2015-07-07 MED ORDER — SODIUM CHLORIDE 0.9 % IV SOLN: 1.0000 g | INTRAVENOUS | Status: DC

## 2015-07-07 MED ORDER — LACTATED RINGERS IV SOLN
500.0000 mL | INTRAVENOUS | Status: DC | PRN
  Administered 2015-07-08 (×2): 500 mL via INTRAVENOUS

## 2015-07-07 MED ORDER — OXYCODONE-ACETAMINOPHEN 5-325 MG PO TABS: 2.0000 | ORAL_TABLET | ORAL | Status: DC | PRN

## 2015-07-07 MED ORDER — LACTATED RINGERS IV SOLN
INTRAVENOUS | Status: DC
  Administered 2015-07-07 – 2015-07-08 (×3): via INTRAVENOUS

## 2015-07-07 MED ORDER — ONDANSETRON HCL 4 MG/2ML IJ SOLN: 4.0000 mg | Freq: Four times a day (QID) | INTRAMUSCULAR | Status: DC | PRN

## 2015-07-07 MED ORDER — OXYTOCIN BOLUS FROM INFUSION: 500.0000 mL | INTRAVENOUS | Status: DC

## 2015-07-07 MED ORDER — OXYTOCIN 40 UNITS IN LACTATED RINGERS INFUSION - SIMPLE MED
1.0000 m[IU]/min | INTRAVENOUS | Status: DC
  Administered 2015-07-08: 2 m[IU]/min via INTRAVENOUS
  Filled 2015-07-07: qty 1000

## 2015-07-07 MED ORDER — FENTANYL CITRATE (PF) 100 MCG/2ML IJ SOLN
50.0000 ug | INTRAMUSCULAR | Status: DC | PRN
  Administered 2015-07-08 (×2): 100 ug via INTRAVENOUS
  Administered 2015-07-08: 50 ug via INTRAVENOUS
  Filled 2015-07-07 (×3): qty 2

## 2015-07-07 MED ORDER — TERBUTALINE SULFATE 1 MG/ML IJ SOLN: 0.2500 mg | Freq: Once | INTRAMUSCULAR | Status: DC | PRN

## 2015-07-07 NOTE — Progress Notes (Addendum)
Galen ManilaM. Shambley, CNM called, [facility], GDM, 39.2wks, BS normal, cervix unchanged from exam in OB clinic, #1 dose Cytotec 25mcg placed. CNM notified, irregular ctx with irritability, fetal tracing reactive, Cat 1 with no decels. CNM confirms GBS neg, antibiotics order cancelled. Will continue with plan for labor induction.

## 2015-07-07 NOTE — Progress Notes (Signed)
Pt smiling and calm, arrived to LD5 via WC in no apparent distress with s/o at side. Introduced self and oriented pt and s/o to room and unit. Pt confirms plan for IOL, GDM, BS wnl, diet controlled. reports active fetal movement, denies painful contractions, back pain, spotting, leaking or gush of fluid or any other labor signs. States clinic visit last Tues, cervix 2cm. Instructions provided for pt to change into gown, then will start inductions process per provider orders.

## 2015-07-08 ENCOUNTER — Inpatient Hospital Stay: Payer: BC Managed Care – PPO | Admitting: Certified Registered Nurse Anesthetist

## 2015-07-08 ENCOUNTER — Encounter: Payer: Self-pay | Admitting: *Deleted

## 2015-07-08 ENCOUNTER — Encounter: Payer: BC Managed Care – PPO | Admitting: Obstetrics and Gynecology

## 2015-07-08 DIAGNOSIS — Z3403 Encounter for supervision of normal first pregnancy, third trimester: Secondary | ICD-10-CM

## 2015-07-08 LAB — CHLAMYDIA/NGC RT PCR (ARMC ONLY)
Chlamydia Tr: NOT DETECTED
N GONORRHOEAE: NOT DETECTED

## 2015-07-08 MED ORDER — OXYCODONE-ACETAMINOPHEN 5-325 MG PO TABS
1.0000 | ORAL_TABLET | ORAL | Status: DC | PRN
Start: 2015-07-08 — End: 2015-07-10

## 2015-07-08 MED ORDER — FENTANYL 2.5 MCG/ML W/ROPIVACAINE 0.2% IN NS 100 ML EPIDURAL INFUSION (ARMC-ANES)
9.0000 mL/h | EPIDURAL | Status: DC
  Administered 2015-07-08: 9 mL/h via EPIDURAL

## 2015-07-08 MED ORDER — OXYCODONE-ACETAMINOPHEN 5-325 MG PO TABS: 2.0000 | ORAL_TABLET | ORAL | Status: DC | PRN

## 2015-07-08 MED ORDER — ACETAMINOPHEN 325 MG PO TABS: 650.0000 mg | ORAL_TABLET | ORAL | Status: DC | PRN

## 2015-07-08 MED ORDER — WITCH HAZEL-GLYCERIN EX PADS: 1.0000 "application " | MEDICATED_PAD | CUTANEOUS | Status: DC | PRN

## 2015-07-08 MED ORDER — PHENYLEPHRINE 40 MCG/ML (10ML) SYRINGE FOR IV PUSH (FOR BLOOD PRESSURE SUPPORT): 80.0000 ug | PREFILLED_SYRINGE | INTRAVENOUS | Status: DC | PRN

## 2015-07-08 MED ORDER — DIPHENHYDRAMINE HCL 50 MG/ML IJ SOLN: 12.5000 mg | INTRAMUSCULAR | Status: DC | PRN

## 2015-07-08 MED ORDER — LANOLIN HYDROUS EX OINT: TOPICAL_OINTMENT | CUTANEOUS | Status: DC | PRN

## 2015-07-08 MED ORDER — TERBUTALINE SULFATE 1 MG/ML IJ SOLN
INTRAMUSCULAR | Status: AC
  Filled 2015-07-08: qty 1

## 2015-07-08 MED ORDER — DIBUCAINE 1 % RE OINT: 1.0000 "application " | TOPICAL_OINTMENT | RECTAL | Status: DC | PRN

## 2015-07-08 MED ORDER — LIDOCAINE HCL (PF) 1 % IJ SOLN
INTRAMUSCULAR | Status: DC | PRN
  Administered 2015-07-08 (×2): 3 mL via SUBCUTANEOUS

## 2015-07-08 MED ORDER — SIMETHICONE 80 MG PO CHEW: 80.0000 mg | CHEWABLE_TABLET | ORAL | Status: DC | PRN

## 2015-07-08 MED ORDER — PHENYLEPHRINE 40 MCG/ML (10ML) SYRINGE FOR IV PUSH (FOR BLOOD PRESSURE SUPPORT)
80.0000 ug | PREFILLED_SYRINGE | INTRAVENOUS | Status: DC | PRN
  Filled 2015-07-08: qty 2

## 2015-07-08 MED ORDER — BENZOCAINE-MENTHOL 20-0.5 % EX AERO
1.0000 | INHALATION_SPRAY | CUTANEOUS | Status: DC | PRN
Start: 2015-07-08 — End: 2015-07-10
  Administered 2015-07-08: 1 via TOPICAL
  Filled 2015-07-08: qty 56

## 2015-07-08 MED ORDER — OXYTOCIN 40 UNITS IN LACTATED RINGERS INFUSION - SIMPLE MED
INTRAVENOUS | Status: AC
  Filled 2015-07-08: qty 1000

## 2015-07-08 MED ORDER — LIDOCAINE-EPINEPHRINE (PF) 1.5 %-1:200000 IJ SOLN
INTRAMUSCULAR | Status: DC | PRN
  Administered 2015-07-08: 4 mL via EPIDURAL

## 2015-07-08 MED ORDER — EPHEDRINE 5 MG/ML INJ: 10.0000 mg | INTRAVENOUS | Status: DC | PRN

## 2015-07-08 MED ORDER — ONDANSETRON HCL 4 MG PO TABS: 4.0000 mg | ORAL_TABLET | ORAL | Status: DC | PRN

## 2015-07-08 MED ORDER — DIPHENHYDRAMINE HCL 25 MG PO CAPS: 25.0000 mg | ORAL_CAPSULE | Freq: Four times a day (QID) | ORAL | Status: DC | PRN

## 2015-07-08 MED ORDER — IBUPROFEN 600 MG PO TABS
600.0000 mg | ORAL_TABLET | Freq: Four times a day (QID) | ORAL | Status: DC
  Administered 2015-07-08 – 2015-07-10 (×6): 600 mg via ORAL
  Filled 2015-07-08 (×6): qty 1

## 2015-07-08 MED ORDER — FENTANYL 2.5 MCG/ML W/ROPIVACAINE 0.2% IN NS 100 ML EPIDURAL INFUSION (ARMC-ANES)
EPIDURAL | Status: AC
  Administered 2015-07-08: 9 mL/h via EPIDURAL
  Filled 2015-07-08: qty 100

## 2015-07-08 MED ORDER — EPHEDRINE 5 MG/ML INJ
10.0000 mg | INTRAVENOUS | Status: DC | PRN
  Filled 2015-07-08: qty 2

## 2015-07-08 MED ORDER — PRENATAL MULTIVITAMIN CH
1.0000 | ORAL_TABLET | Freq: Every day | ORAL | Status: DC
  Administered 2015-07-09: 1 via ORAL
  Filled 2015-07-08: qty 1

## 2015-07-08 MED ORDER — DOCUSATE SODIUM 100 MG PO CAPS
100.0000 mg | ORAL_CAPSULE | Freq: Two times a day (BID) | ORAL | Status: DC
  Administered 2015-07-08 – 2015-07-10 (×4): 100 mg via ORAL
  Filled 2015-07-08 (×4): qty 1

## 2015-07-08 MED ORDER — ONDANSETRON HCL 4 MG/2ML IJ SOLN: 4.0000 mg | INTRAMUSCULAR | Status: DC | PRN

## 2015-07-08 MED ORDER — LACTATED RINGERS IV SOLN: 500.0000 mL | Freq: Once | INTRAVENOUS | Status: DC

## 2015-07-08 NOTE — Progress Notes (Signed)
Pt sitting on birthing ball at bedside, then standing and having sudden large gush of fluid, clear, no odor. Pt provided with new gown and peripad, in restroom to change into new gown since SROM.

## 2015-07-08 NOTE — Progress Notes (Signed)
Pt returned to room from ambulating in hallway, states she is not sure if her water broke, but felt something wet on her leg, wiped and not seeing any more wetness, pt provided with peripad to wear while walking, will report if she feeling increased leaking or gush of fluid or peripad becomes soaked. Pt out of room with s/o at side to continue walking in hall.

## 2015-07-08 NOTE — Progress Notes (Signed)
Erica Gibson is a 25 y.o. G1P0 at 4036w3d by LMP admitted for induction of labor due to Gestational diabetes.  Subjective:   Objective: BP 103/68 mmHg  Pulse 93  Temp(Src) 97.7 F (36.5 C) (Oral)  Resp 18  Ht 5\' 5"  (1.651 m)  Wt 83.915 kg (185 lb)  BMI 30.79 kg/m2  SpO2 99%  LMP 10/05/2014 (Exact Date) I/O last 3 completed shifts: In: 1000 [I.V.:1000] Out: -  Total I/O In: 1085.4 [I.V.:1085.4] Out: -   FHT:  FHR: 140 bpm, variability: moderate,  accelerations:  Present,  decelerations:  Absent UC:   irregular, every 1-3 minutes, moderate palpation SVE:   Dilation: 6 Effacement (%): 90 Station: 0 Exam by:: Erica Gibson   Labs: Lab Results  Component Value Date   WBC 10.4 07/07/2015   HGB 12.6 07/07/2015   HCT 35.7 07/07/2015   MCV 92.3 07/07/2015   PLT 179 07/07/2015    Assessment / Plan: IOL progressing well since SROM at 4 am.  Labor: Progressing normally Preeclampsia:  labs stable Fetal Wellbeing:  Category I Pain Control:  IV pain meds I/D:  n/a Anticipated MOD:  NSVD  Erica Gibson, CNM 07/08/2015, 12:53 PM

## 2015-07-08 NOTE — Plan of Care (Signed)
Problem: Education: Goal: Knowledge of Childbirth will improve Outcome: Progressing Discussed with pt and s/o what to expect during induction of labor  process, reviewed Cytotec, Pitocin, IV pain med and Epidural to manage labor pain mgmt.  Goal: Ability to state and carry out methods to decrease the pain will improve Outcome: Progressing Fentanyl given sivp, pt reports IV pain med helped relieve some of the cramping pain associated with contractions  Problem: Coping: Goal: Ability to identify appropriate support needs for the childbearing family will improve Outcome: Progressing Pt has s/o, family and friends in room, supportive and encouraging pt during labor process Goal: Ability to verbalize concerns and feelings about labor and delivery will improve Outcome: Progressing Pt requesting to ambulate during shift and prefers sitting on birthing ball rather than lying in bed, pt doing well breathing through contractions while laboring since SROM, GBS neg.   Problem: Life Cycle: Goal: Ability to make normal progression through stages of labor will improve Outcome: Progressing Cervical change noted since pt admission, 2.5-3 cm dilated, continues to have clear leakage of fluid, no odor, no bloody show, no nausea or vomiting.

## 2015-07-08 NOTE — Progress Notes (Signed)
Melody, CNM called to provide update, recent cervical exam unchanged, Cytotec 2nd dose held d/t pt contraction pattern, pt reports she felt like not much time between contractions and starting to feel cramping during contractions. CNM states she would like pt to be doing something, ambulate with tele monitors and then if unchanged in 1.5-2 hrs, start Pitocin.

## 2015-07-08 NOTE — Anesthesia Procedure Notes (Signed)
Epidural Patient location during procedure: OB  Staffing Anesthesiologist: Yevette EdwardsADAMS, JAMES G Resident/CRNA: Malva CoganBEANE, Adrean Findlay Performed by: anesthesiologist   Preanesthetic Checklist Completed: patient identified, site marked, surgical consent, pre-op evaluation, timeout performed, IV checked, risks and benefits discussed and monitors and equipment checked  Epidural Patient position: sitting Prep: Betadine Patient monitoring: heart rate, continuous pulse ox and blood pressure Approach: midline Location: L3-L4 Injection technique: LOR saline  Needle:  Needle type: Tuohy  Needle gauge: 18 G Needle length: 9 cm and 9 Needle insertion depth: 6 cm Catheter type: closed end flexible Catheter size: 20 Guage Catheter at skin depth: 11 cm Test dose: negative and 1.5% lidocaine with Epi 1:200 K  Assessment Events: blood not aspirated, injection not painful, no injection resistance, negative IV test and no paresthesia  Additional Notes   Patient tolerated the insertion well without complications.Reason for block:procedure for pain

## 2015-07-08 NOTE — Anesthesia Preprocedure Evaluation (Signed)
Anesthesia Evaluation  Patient identified by MRN, date of birth, ID band Patient awake    Reviewed: Allergy & Precautions, NPO status , Patient's Chart, lab work & pertinent test results  Airway Mallampati: II       Dental   Pulmonary           Cardiovascular      Neuro/Psych    GI/Hepatic   Endo/Other  diabetes, Gestational  Renal/GU      Musculoskeletal   Abdominal   Peds  Hematology   Anesthesia Other Findings   Reproductive/Obstetrics (+) Pregnancy                             Anesthesia Physical Anesthesia Plan  ASA: II  Anesthesia Plan: Epidural   Post-op Pain Management:    Induction:   Airway Management Planned:   Additional Equipment:   Intra-op Plan:   Post-operative Plan:   Informed Consent:   Plan Discussed with: CRNA and Anesthesiologist  Anesthesia Plan Comments:         Anesthesia Quick Evaluation

## 2015-07-08 NOTE — Progress Notes (Signed)
Jackey LogeChandler N Mcilwain is a 25 y.o. G1P0 at 2055w3d by LMP admitted for induction of labor due to Gestational diabetes.  Subjective: Reports no pain with epidural  Objective: BP 100/67 mmHg  Pulse 91  Temp(Src) 97.8 F (36.6 C) (Oral)  Resp 18  Ht 5\' 5"  (1.651 m)  Wt 83.915 kg (185 lb)  BMI 30.79 kg/m2  SpO2 99%  LMP 10/05/2014 (Exact Date) I/O last 3 completed shifts: In: 1000 [I.V.:1000] Out: -  Total I/O In: 2826.7 [I.V.:2797.9; Other:28.8] Out: -   FHT:  FHR: 135 bpm, variability: moderate,  accelerations:  Present,  decelerations:  Absent UC:   irregular, every 6-10 minutes SVE:   Dilation: 10 Effacement (%): 100 Station: 0 Exam by:: JCM  Labs: Lab Results  Component Value Date   WBC 10.4 07/07/2015   HGB 12.6 07/07/2015   HCT 35.7 07/07/2015   MCV 92.3 07/07/2015   PLT 179 07/07/2015    Assessment / Plan: Spontaneous labor, progressing normally  Labor: Progressing normally Preeclampsia:  labs stable Fetal Wellbeing:  Category I Pain Control:  Epidural I/D:  n/a Anticipated MOD:  NSVD  Maurizio Geno N Shir Bergman 07/08/2015, 6:23 PM

## 2015-07-08 NOTE — H&P (Signed)
Obstetric History and Physical  Erica Gibson is a 25 y.o. G1P0 with IUP at [redacted]w[redacted]d presenting with GDM for IOL. Patient states she has been having  none contractions, none vaginal bleeding, intact membranes, with active fetal movement.    Prenatal Course Source of Care: [facility]  Pregnancy complications or risks:GDM- well controlled with diet  Prenatal labs and studies: ABO, Rh: --/--/O POS (03/14 2057) Antibody: NEG (03/14 2056) Rubella: 3.88 (08/12 1144) RPR: Non Reactive (08/12 1144)  HBsAg: Negative (08/12 1144)  HIV: Non Reactive (08/12 1144)  GBS: negative 1 hr Glucola  elevated Genetic screening normal Anatomy US normal  Past Medical History  Diagnosis Date  . Gestational diabetes     Past Surgical History  Procedure Laterality Date  . No past surgeries      OB History  Gravida Para Term Preterm AB SAB TAB Ectopic Multiple Living  1             # Outcome Date GA Lbr Len/2nd Weight Sex Delivery Anes PTL Lv  1 Current               Social History   Social History  . Marital Status: Single    Spouse Name: N/A  . Number of Children: N/A  . Years of Education: N/A   Social History Main Topics  . Smoking status: Never Smoker   . Smokeless tobacco: Never Used  . Alcohol Use: No  . Drug Use: No  . Sexual Activity: Yes    Birth Control/ Protection: None   Other Topics Concern  . None   Social History Narrative    Family History  Problem Relation Age of Onset  . Hyperlipidemia Father     Prescriptions prior to admission  Medication Sig Dispense Refill Last Dose  . glucose blood test strip Use as instructed 100 each 12 07/07/2015 at Unknown time  . Prenatal Vit-Fe Fumarate-FA (PRENATAL MULTIVITAMIN) TABS tablet Take 1 tablet by mouth daily at 12 noon.   07/07/2015 at Unknown time    No Known Allergies  Review of Systems: Negative except for what is mentioned in HPI.  Physical Exam: BP 113/71 mmHg  Pulse 91  Temp(Src) 97.5 F (36.4 C) (Oral)   Resp 18  Ht  (1.651 m)  Wt 83.915 kg (185 lb)  BMI 30.79 kg/m2  SpO2 99%  LMP 10/05/2014 (Exact Date) GENERAL: Well-developed, well-nourished female in no acute distress.  LUNGS: Clear to auscultation bilaterally.  HEART: Regular rate and rhythm. ABDOMEN: Soft, nontender, nondistended, gravid. EXTREMITIES: Nontender, no edema, 2+ distal pulses. Cervical Exam: Dilation: 4 Effacement (%): 70, 80 Cervical Position: Middle Station: -2, -1 Presentation: Vertex Exam by:: Galen Manila FHT:  Baseline rate 140 bpm   Variability moderate  Accelerations present   Decelerations none Contractions: Every 2-3 mins after one dose cytotec at 2115p last night, had SROM at 0400a of clear fluid.   Pertinent Labs/Studies:   Results for orders placed or performed during the hospital encounter of 07/07/15 (from the past 24 hour(s))  Glucose, capillary     Status: Abnormal   Collection Time: 07/07/15  8:36 PM  Result Value Ref Range   Glucose-Capillary 114 (H) 65 - 99 mg/dL  CBC     Status: None   Collection Time: 07/07/15  8:55 PM  Result Value Ref Range   WBC 10.4 3.6 - 11.0 K/uL   RBC 3.87 3.80 - 5.20 MIL/uL   Hemoglobin 12.6 12.0 - 16.0 g/dL   HCT  35.7 35.0 - 47.0 %   MCV 92.3 80.0 - 100.0 fL   MCH 32.5 26.0 - 34.0 pg   MCHC 35.2 32.0 - 36.0 g/dL   RDW 16.112.0 09.611.5 - 04.514.5 %   Platelets 179 150 - 440 K/uL  Type and screen     Status: None   Collection Time: 07/07/15  8:56 PM  Result Value Ref Range   ABO/RH(D) O POS    Antibody Screen NEG    Sample Expiration 07/10/2015   ABO/Rh     Status: None   Collection Time: 07/07/15  8:57 PM  Result Value Ref Range   ABO/RH(D) O POS     Assessment : Erica Gibson is a 25 y.o. G1P0 at 8474w3d being admitted for labor.  Plan: Labor: Induction/Augmentation as needed, per protocol FWB: Reassuring fetal heart tracing. Occasional variable decels with spontaneous recovery to baseline. GBS negative Delivery plan: Hopeful for vaginal  delivery  Erica Gibson, CNM [facility]

## 2015-07-08 NOTE — Progress Notes (Addendum)
At bedside speaking with pt and s/o about option to ambulate in hallway, pt agrees she would prefer to walk since bed is hard and she has been unable to rest, earlier refused offer for Ambien. EFM changed to tele monitoring system to promote continued fetal monitoring while pt ambulates or when sitting on birthing ball.   Pt ambulating in hallway with steady stride at normal pace, tele monitor not tracing well, intermittent maternal HR 90-100s tracing noted, when pt stops or slows down during walking FHR tracing improves, 140-145 bpm with accels.

## 2015-07-08 NOTE — Progress Notes (Signed)
Spoke with Melody, CNM, discussed decel with FHR down to 90's. Pulse ox on and IVF bolus given, pt repositioned from side to side and FHR gradually improving to 135's - 140's bpm. Per CNM, continue close monitoring, if tracing is nonreassuring call back with update, otherwise, hold next dose of Cytotec until 0230a and place if reassuring fetal tracing.

## 2015-07-09 LAB — CBC
HEMATOCRIT: 32.3 % — AB (ref 35.0–47.0)
Hemoglobin: 11.4 g/dL — ABNORMAL LOW (ref 12.0–16.0)
MCH: 32.2 pg (ref 26.0–34.0)
MCHC: 35.2 g/dL (ref 32.0–36.0)
MCV: 91.6 fL (ref 80.0–100.0)
Platelets: 183 10*3/uL (ref 150–440)
RBC: 3.53 MIL/uL — AB (ref 3.80–5.20)
RDW: 12.4 % (ref 11.5–14.5)
WBC: 14.1 10*3/uL — AB (ref 3.6–11.0)

## 2015-07-09 LAB — RPR: RPR: NONREACTIVE

## 2015-07-09 NOTE — Progress Notes (Signed)
Post Partum Day 1 Subjective: up ad lib and voiding  Objective: Blood pressure 88/51, pulse 78, temperature 98 F (36.7 C), temperature source Oral, resp. rate 20, height 5\' 5"  (1.651 m), weight 83.915 kg (185 lb), last menstrual period 10/05/2014, SpO2 98 %, unknown if currently breastfeeding.  Physical Exam:  General: alert, cooperative and appears stated age Lochia: appropriate Uterine Fundus: firm Incision: NA DVT Evaluation: No evidence of DVT seen on physical exam. Negative Homan's sign.   Recent Labs  07/07/15 2055 07/09/15 0723  HGB 12.6 11.4*  HCT 35.7 32.3*    Assessment/Plan: Plan for discharge tomorrow and Breastfeeding Infant feeding only Breast;    LOS: 2 days   Danel Requena N Camreigh Michie 07/09/2015, 8:07 AM

## 2015-07-09 NOTE — Anesthesia Postprocedure Evaluation (Signed)
Anesthesia Post Note  Patient: Erica Gibson  Procedure(s) Performed: * No procedures listed *  Patient location during evaluation: Mother Baby Anesthesia Type: Epidural Level of consciousness: awake, oriented and awake and alert Pain management: pain level controlled Vital Signs Assessment: post-procedure vital signs reviewed and stable Respiratory status: spontaneous breathing and nonlabored ventilation Cardiovascular status: stable Postop Assessment: no headache, no backache and no signs of nausea or vomiting Anesthetic complications: no    Last Vitals:  Filed Vitals:   07/08/15 2236 07/09/15 0413  BP: 105/61 107/67  Pulse: 94 103  Temp: 36.9 C 36.9 C  Resp: 20 18    Last Pain:  Filed Vitals:   07/09/15 0414  PainSc: 0-No pain                 Casey Burkitthuy Rogerio Boutelle

## 2015-07-10 MED ORDER — VITAMIN D3 125 MCG (5000 UT) PO CAPS: 1.0000 | ORAL_CAPSULE | Freq: Every day | ORAL | Status: DC

## 2015-07-10 MED ORDER — NORETHINDRONE 0.35 MG PO TABS: 1.0000 | ORAL_TABLET | Freq: Every day | ORAL | Status: DC

## 2015-07-10 MED ORDER — DOCUSATE SODIUM 100 MG PO CAPS: 100.0000 mg | ORAL_CAPSULE | Freq: Two times a day (BID) | ORAL | Status: DC

## 2015-07-10 MED ORDER — IBUPROFEN 600 MG PO TABS: 600.0000 mg | ORAL_TABLET | Freq: Four times a day (QID) | ORAL | Status: DC

## 2015-07-10 NOTE — Discharge Summary (Signed)
Obstetric Discharge Summary Reason for Admission: induction of labor Prenatal Procedures: NST and ultrasound Intrapartum Procedures: spontaneous vaginal delivery and 2nd degree laceration Postpartum Procedures: none Complications-Operative and Postpartum: 2nd degree perineal laceration HEMOGLOBIN  Date Value Ref Range Status  07/09/2015 11.4* 12.0 - 16.0 g/dL Final   HCT  Date Value Ref Range Status  07/09/2015 32.3* 35.0 - 47.0 % Final   HEMATOCRIT  Date Value Ref Range Status  04/21/2015 35.3 34.0 - 46.6 % Final    Physical Exam:  General: alert, cooperative and appears stated age 31Lochia: appropriate Uterine Fundus: firm Incision: NA DVT Evaluation: No evidence of DVT seen on physical exam.  Discharge Diagnoses: Term Pregnancy-delivered and GDM  Discharge Information: Date: 07/10/2015 Activity: pelvic rest Diet: routine Medications: PNV, Ibuprofen, Colace and camila to start in 2 weeks, vit D3 5000IU daily while breast feeding Condition: stable Instructions: refer to practice specific booklet Discharge to: home Follow-up Information    Follow up with Erica Suzan NailerN Gibson, CNM. Schedule an appointment as soon as possible for a visit in 6 weeks.   Specialties:  Obstetrics and Gynecology, Radiology   Why:  postpartum visit   Contact information:   [address] [place] [id number] [phone]       Newborn Data: Live born female Erica Gibson Birth Weight:   APGAR: 8, 9  Home with mother.  Erica Gibson, CNM 07/10/2015, 8:39 AMd  I have reviewed the record and concur with patient management and plan. Erica Gibson, Erica DeutscherMARTIN, MD, Erica CoreFACOG

## 2015-07-10 NOTE — Discharge Instructions (Signed)
Vaginal Delivery, Care After °Refer to this sheet in the next few weeks. These discharge instructions provide you with information on caring for yourself after delivery. Your health care provider may also give you specific instructions. Your treatment has been planned according to the most current medical practices available, but problems sometimes occur. Call your health care provider if you have any problems or questions after you go home. °HOME CARE INSTRUCTIONS °· Take over-the-counter or prescription medicines only as directed by your health care provider or pharmacist. °· Do not drink alcohol, especially if you are breastfeeding or taking medicine to relieve pain. °· Do not chew or smoke tobacco. °· Do not use illegal drugs. °· Continue to use good perineal care. Good perineal care includes: °¨ Wiping your perineum from front to back. °¨ Keeping your perineum clean. °· Do not use tampons or douche for the next 6 weeks.  °· Showers only, no tub baths for the next 6 weeks. °· Wear a well-fitting bra that provides breast support. °· Eat healthy foods. °· Drink enough fluids to keep your urine clear or pale yellow. °· Eat high-fiber foods such as whole grain cereals and breads, brown rice, beans, and fresh fruits and vegetables every day. These foods may help prevent or relieve constipation. °· Follow your health care provider's recommendations regarding resumption of activities such as climbing stairs, driving, lifting, exercising, or traveling. °· No sexual intercourse for at least the next 6 weeks, and then not until you feel ready.  °· Try to have someone help you with your household activities and your newborn for at least a few days after you leave the hospital. °· Rest as much as possible. Try to rest or take a nap when your newborn is sleeping. °· Increase your activities gradually. °· Keep all of your scheduled postpartum appointments. It is very important to keep your scheduled follow-up appointments. At  these appointments, your health care provider will be checking to make sure that you are healing physically and emotionally. °SEEK MEDICAL CARE IF:  °· You are passing large clots from your vagina. Save any clots to show your health care provider. °· You have a foul smelling discharge from your vagina. °· You have trouble urinating. °· You are urinating frequently. °· You have pain when you urinate. °· You have a change in your bowel movements. °· You have increasing redness, pain, or swelling near your vaginal incision (episiotomy) or vaginal tear. °· You have pus draining from your episiotomy or vaginal tear. °· Your episiotomy or vaginal tear is separating. °· You have painful, hard, or reddened breasts. °· You have a severe headache. °· You have blurred vision or see spots. °· You feel sad or depressed. °· You have thoughts of hurting yourself or your newborn. °· You have questions about your care, the care of your newborn, or medicines. °· You are dizzy or light-headed. °· You have a rash. °· You have nausea or vomiting. °· You were breastfeeding and have not had a menstrual period within 12 weeks after you stopped breastfeeding. °· You are not breastfeeding and have not had a menstrual period by the 12th week after delivery. °· You have a fever. °SEEK IMMEDIATE MEDICAL CARE IF:  °· You have persistent pain. °· You have chest pain. °· You have shortness of breath. °· You faint. °· You have leg pain. °· You have stomach pain. °· Your vaginal bleeding saturates two or more sanitary pads in 1 hour. °  °This   information is not intended to replace advice given to you by your health care provider. Make sure you discuss any questions you have with your health care provider. °  °Document Released: 04/08/2000 Document Revised: 12/31/2014 Document Reviewed: 12/07/2011 °Elsevier Interactive Patient Education ©2016 [employer] ° °

## 2015-07-10 NOTE — Progress Notes (Signed)
Discharge instructions complete. Patient verbalizes understanding of teaching. Patient discharged home at 1039.

## 2015-08-20 ENCOUNTER — Encounter: Payer: Self-pay | Admitting: Obstetrics and Gynecology

## 2015-08-20 ENCOUNTER — Ambulatory Visit (INDEPENDENT_AMBULATORY_CARE_PROVIDER_SITE_OTHER): Payer: BC Managed Care – PPO | Admitting: Obstetrics and Gynecology

## 2015-08-20 MED ORDER — NORETHIN ACE-ETH ESTRAD-FE 1-20 MG-MCG(24) PO TABS
1.0000 | ORAL_TABLET | Freq: Every day | ORAL | Status: DC
Start: 2015-08-20 — End: 2016-07-26

## 2015-08-20 NOTE — Patient Instructions (Signed)
  Place postpartum visit patient instructions here.  

## 2015-08-20 NOTE — Progress Notes (Signed)
  Subjective:     Erica Gibson is a 25 y.o. female who presents for a postpartum visit. She is 6 weeks postpartum following a spontaneous vaginal delivery. I have fully reviewed the prenatal and intrapartum course. The delivery was at 39 gestational weeks. Outcome: spontaneous vaginal delivery. Anesthesia: epidural. Postpartum course has been uncomplicated. Baby's course has been uncomplicated. Baby is feeding by switched to formula 2 weeks. Bleeding no bleeding. Bowel function is normal. Bladder function is normal. Patient is not sexually active. Contraception method is oral progesterone-only contraceptive. Postpartum depression screening: negative.  The following portions of the patient's history were reviewed and updated as appropriate: allergies, current medications, past family history, past medical history, past social history, past surgical history and problem list.  Review of Systems A comprehensive review of systems was negative.   Objective:    BP 105/72 mmHg  Pulse 92  Ht 5\' 6"  (1.676 m)  Wt 167 lb (75.751 kg)  BMI 26.97 kg/m2  Breastfeeding? No  General:  alert, cooperative and appears stated age   Breasts:  not examined  Lungs: clear to auscultation bilaterally  Heart:  regular rate and rhythm, S1, S2 normal, no murmur, click, rub or gallop  Abdomen: soft, non-tender; bowel sounds normal; no masses,  no organomegaly   Vulva:  normal  Vagina: normal vagina  Cervix:  multiparous appearance  Corpus: normal size, contour, position, consistency, mobility, non-tender  Adnexa:  normal adnexa and no mass, fullness, tenderness  Rectal Exam: Not performed.        Assessment:     6 weeks postpartum exam. Pap smear not done at today's visit.   Plan:    1. Contraception: OCP (estrogen/progesterone) 2. GDM sugar rechecked today 3. Follow up in: 6 months or as needed.

## 2015-08-21 ENCOUNTER — Telehealth: Payer: Self-pay | Admitting: *Deleted

## 2015-08-21 LAB — GLUCOSE, RANDOM: GLUCOSE: 57 mg/dL — AB (ref 65–99)

## 2015-08-21 NOTE — Telephone Encounter (Signed)
Notified pt of results 

## 2015-08-21 NOTE — Telephone Encounter (Signed)
-----   Message from Purcell NailsMelody N Shamble[place], [place] sent at 08/21/2015  8:12 AM EDT ----- Please let her know her glucose was actually low, so no signs of diabetes

## 2015-12-15 ENCOUNTER — Encounter: Payer: BC Managed Care – PPO | Admitting: Obstetrics and Gynecology

## 2015-12-24 ENCOUNTER — Ambulatory Visit: Payer: BC Managed Care – PPO | Admitting: Obstetrics and Gynecology

## 2016-01-21 ENCOUNTER — Ambulatory Visit (INDEPENDENT_AMBULATORY_CARE_PROVIDER_SITE_OTHER): Payer: BC Managed Care – PPO | Admitting: Obstetrics and Gynecology

## 2016-01-21 ENCOUNTER — Encounter: Payer: Self-pay | Admitting: Obstetrics and Gynecology

## 2016-01-21 VITALS — BP 121/84 | HR 88 | Ht 65.0 in | Wt 169.7 lb

## 2016-01-21 DIAGNOSIS — Z01419 Encounter for gynecological examination (general) (routine) without abnormal findings: Secondary | ICD-10-CM

## 2016-01-21 NOTE — Progress Notes (Signed)
   Subjective:     Erica Gibson is a 25 y.o. female and is here for a comprehensive physical exam. The patient reports no problems.  Social History   Social History  . Marital status: Single    Spouse name: N/A  . Number of children: N/A  . Years of education: N/A   Occupational History  . Not on file.   Social History Main Topics  . Smoking status: Never Smoker  . Smokeless tobacco: Never Used  . Alcohol use No  . Drug use: No  . Sexual activity: Yes    Birth control/ protection: None, Pill   Other Topics Concern  . Not on file   Social History Narrative  . No narrative on file   Health Maintenance  Topic Date Due  . HEMOGLOBIN A1C  06/15/1990  . PNEUMOCOCCAL POLYSACCHARIDE VACCINE (1) 09/11/1992  . FOOT EXAM  09/11/2000  . OPHTHALMOLOGY EXAM  09/11/2000  . URINE MICROALBUMIN  09/11/2000  . PAP SMEAR  09/12/2011  . INFLUENZA VACCINE  11/24/2015  . TETANUS/TDAP  04/20/2025  . HIV Screening  Completed    The following portions of the patient's history were reviewed and updated as appropriate: allergies, current medications, past family history, past medical history, past social history, past surgical history and problem list.  Review of Systems A comprehensive review of systems was negative.   Objective:    General appearance: alert, cooperative and appears stated age Neck: no adenopathy, no carotid bruit, no JVD, supple, symmetrical, trachea midline and thyroid not enlarged, symmetric, no tenderness/mass/nodules Lungs: clear to auscultation bilaterally Breasts: normal appearance, no masses or tenderness Heart: regular rate and rhythm, S1, S2 normal, no murmur, click, rub or gallop Abdomen: soft, non-tender; bowel sounds normal; no masses,  no organomegaly Pelvic: cervix normal in appearance, external genitalia normal, no adnexal masses or tenderness, no cervical motion tenderness, rectovaginal septum normal, uterus normal size, shape, and consistency and  vagina normal without discharge    Assessment:    Healthy female exam. OCP user     Plan:  Continue on current OCP RTC 1 year or as needed.   See After Visit Summary for Counseling Recommendations

## 2016-01-21 NOTE — Patient Instructions (Signed)
Preventive Care for Adults, Female A healthy lifestyle and preventive care can promote health and wellness. Preventive health guidelines for women include the following key practices.  A routine yearly physical is a good way to check with your health care provider about your health and preventive screening. It is a chance to share any concerns and updates on your health and to receive a thorough exam.  Visit your dentist for a routine exam and preventive care every 6 months. Brush your teeth twice a day and floss once a day. Good oral hygiene prevents tooth decay and gum disease.  The frequency of eye exams is based on your age, health, family medical history, use of contact lenses, and other factors. Follow your health care provider's recommendations for frequency of eye exams.  Eat a healthy diet. Foods like vegetables, fruits, whole grains, low-fat dairy products, and lean protein foods contain the nutrients you need without too many calories. Decrease your intake of foods high in solid fats, added sugars, and salt. Eat the right amount of calories for you.Get information about a proper diet from your health care provider, if necessary.  Regular physical exercise is one of the most important things you can do for your health. Most adults should get at least 150 minutes of moderate-intensity exercise (any activity that increases your heart rate and causes you to sweat) each week. In addition, most adults need muscle-strengthening exercises on 2 or more days a week.  Maintain a healthy weight. The body mass index (BMI) is a screening tool to identify possible weight problems. It provides an estimate of body fat based on height and weight. Your health care provider can find your BMI and can help you achieve or maintain a healthy weight.For adults 20 years and older:  A BMI below 18.5 is considered underweight.  A BMI of 18.5 to 24.9 is normal.  A BMI of 25 to 29.9 is considered  overweight.  A BMI of 30 and above is considered obese.  Maintain normal blood lipids and cholesterol levels by exercising and minimizing your intake of saturated fat. Eat a balanced diet with plenty of fruit and vegetables. Blood tests for lipids and cholesterol should begin at age 64 and be repeated every 5 years. If your lipid or cholesterol levels are high, you are over 50, or you are at high risk for heart disease, you may need your cholesterol levels checked more frequently.Ongoing high lipid and cholesterol levels should be treated with medicines if diet and exercise are not working.  If you smoke, find out from your health care provider how to quit. If you do not use tobacco, do not start.  Lung cancer screening is recommended for adults aged 52-80 years who are at high risk for developing lung cancer because of a history of smoking. A yearly low-dose CT scan of the lungs is recommended for people who have at least a 30-pack-year history of smoking and are a current smoker or have quit within the past 15 years. A pack year of smoking is smoking an average of 1 pack of cigarettes a day for 1 year (for example: 1 pack a day for 30 years or 2 packs a day for 15 years). Yearly screening should continue until the smoker has stopped smoking for at least 15 years. Yearly screening should be stopped for people who develop a health problem that would prevent them from having lung cancer treatment.  If you are pregnant, do not drink alcohol. If you are  breastfeeding, be very cautious about drinking alcohol. If you are not pregnant and choose to drink alcohol, do not have more than 1 drink per day. One drink is considered to be 12 ounces (355 mL) of beer, 5 ounces (148 mL) of wine, or 1.5 ounces (44 mL) of liquor.  Avoid use of street drugs. Do not share needles with anyone. Ask for help if you need support or instructions about stopping the use of drugs.  High blood pressure causes heart disease and  increases the risk of stroke. Your blood pressure should be checked at least every 1 to 2 years. Ongoing high blood pressure should be treated with medicines if weight loss and exercise do not work.  If you are 25-78 years old, ask your health care provider if you should take aspirin to prevent strokes.  Diabetes screening is done by taking a blood sample to check your blood glucose level after you have not eaten for a certain period of time (fasting). If you are not overweight and you do not have risk factors for diabetes, you should be screened once every 3 years starting at age 86. If you are overweight or obese and you are 3-87 years of age, you should be screened for diabetes every year as part of your cardiovascular risk assessment.  Breast cancer screening is essential preventive care for women. You should practice "breast self-awareness." This means understanding the normal appearance and feel of your breasts and may include breast self-examination. Any changes detected, no matter how small, should be reported to a health care provider. Women in their 66s and 30s should have a clinical breast exam (CBE) by a health care provider as part of a regular health exam every 1 to 3 years. After age 43, women should have a CBE every year. Starting at age 37, women should consider having a mammogram (breast X-ray test) every year. Women who have a family history of breast cancer should talk to their health care provider about genetic screening. Women at a high risk of breast cancer should talk to their health care providers about having an MRI and a mammogram every year.  Breast cancer gene (BRCA)-related cancer risk assessment is recommended for women who have family members with BRCA-related cancers. BRCA-related cancers include breast, ovarian, tubal, and peritoneal cancers. Having family members with these cancers may be associated with an increased risk for harmful changes (mutations) in the breast  cancer genes BRCA1 and BRCA2. Results of the assessment will determine the need for genetic counseling and BRCA1 and BRCA2 testing.  Your health care provider may recommend that you be screened regularly for cancer of the pelvic organs (ovaries, uterus, and vagina). This screening involves a pelvic examination, including checking for microscopic changes to the surface of your cervix (Pap test). You may be encouraged to have this screening done every 3 years, beginning at age 78.  For women ages 79-65, health care providers may recommend pelvic exams and Pap testing every 3 years, or they may recommend the Pap and pelvic exam, combined with testing for human papilloma virus (HPV), every 5 years. Some types of HPV increase your risk of cervical cancer. Testing for HPV may also be done on women of any age with unclear Pap test results.  Other health care providers may not recommend any screening for nonpregnant women who are considered low risk for pelvic cancer and who do not have symptoms. Ask your health care provider if a screening pelvic exam is right for  you.  If you have had past treatment for cervical cancer or a condition that could lead to cancer, you need Pap tests and screening for cancer for at least 20 years after your treatment. If Pap tests have been discontinued, your risk factors (such as having a new sexual partner) need to be reassessed to determine if screening should resume. Some women have medical problems that increase the chance of getting cervical cancer. In these cases, your health care provider may recommend more frequent screening and Pap tests.  Colorectal cancer can be detected and often prevented. Most routine colorectal cancer screening begins at the age of 50 years and continues through age 75 years. However, your health care provider may recommend screening at an earlier age if you have risk factors for colon cancer. On a yearly basis, your health care provider may provide  home test kits to check for hidden blood in the stool. Use of a small camera at the end of a tube, to directly examine the colon (sigmoidoscopy or colonoscopy), can detect the earliest forms of colorectal cancer. Talk to your health care provider about this at age 50, when routine screening begins. Direct exam of the colon should be repeated every 5-10 years through age 75 years, unless early forms of precancerous polyps or small growths are found.  People who are at an increased risk for hepatitis B should be screened for this virus. You are considered at high risk for hepatitis B if:  You were born in a country where hepatitis B occurs often. Talk with your health care provider about which countries are considered high risk.  Your parents were born in a high-risk country and you have not received a shot to protect against hepatitis B (hepatitis B vaccine).  You have HIV or AIDS.  You use needles to inject street drugs.  You live with, or have sex with, someone who has hepatitis B.  You get hemodialysis treatment.  You take certain medicines for conditions like cancer, organ transplantation, and autoimmune conditions.  Hepatitis C blood testing is recommended for all people born from 1945 through 1965 and any individual with known risks for hepatitis C.  Practice safe sex. Use condoms and avoid high-risk sexual practices to reduce the spread of sexually transmitted infections (STIs). STIs include gonorrhea, chlamydia, syphilis, trichomonas, herpes, HPV, and human immunodeficiency virus (HIV). Herpes, HIV, and HPV are viral illnesses that have no cure. They can result in disability, cancer, and death.  You should be screened for sexually transmitted illnesses (STIs) including gonorrhea and chlamydia if:  You are sexually active and are younger than 24 years.  You are older than 24 years and your health care provider tells you that you are at risk for this type of infection.  Your sexual  activity has changed since you were last screened and you are at an increased risk for chlamydia or gonorrhea. Ask your health care provider if you are at risk.  If you are at risk of being infected with HIV, it is recommended that you take a prescription medicine daily to prevent HIV infection. This is called preexposure prophylaxis (PrEP). You are considered at risk if:  You are sexually active and do not regularly use condoms or know the HIV status of your partner(s).  You take drugs by injection.  You are sexually active with a partner who has HIV.  Talk with your health care provider about whether you are at high risk of being infected with HIV. If   you choose to begin PrEP, you should first be tested for HIV. You should then be tested every 3 months for as long as you are taking PrEP.  Osteoporosis is a disease in which the bones lose minerals and strength with aging. This can result in serious bone fractures or breaks. The risk of osteoporosis can be identified using a bone density scan. Women ages 1 years and over and women at risk for fractures or osteoporosis should discuss screening with their health care providers. Ask your health care provider whether you should take a calcium supplement or vitamin D to reduce the rate of osteoporosis.  Menopause can be associated with physical symptoms and risks. Hormone replacement therapy is available to decrease symptoms and risks. You should talk to your health care provider about whether hormone replacement therapy is right for you.  Use sunscreen. Apply sunscreen liberally and repeatedly throughout the day. You should seek shade when your shadow is shorter than you. Protect yourself by wearing long sleeves, pants, a wide-brimmed hat, and sunglasses year round, whenever you are outdoors.  Once a month, do a whole body skin exam, using a mirror to look at the skin on your back. Tell your health care provider of new moles, moles that have irregular  borders, moles that are larger than a pencil eraser, or moles that have changed in shape or color.  Stay current with required vaccines (immunizations).  Influenza vaccine. All adults should be immunized every year.  Tetanus, diphtheria, and acellular pertussis (Td, Tdap) vaccine. Pregnant women should receive 1 dose of Tdap vaccine during each pregnancy. The dose should be obtained regardless of the length of time since the last dose. Immunization is preferred during the 27th-36th week of gestation. An adult who has not previously received Tdap or who does not know her vaccine status should receive 1 dose of Tdap. This initial dose should be followed by tetanus and diphtheria toxoids (Td) booster doses every 10 years. Adults with an unknown or incomplete history of completing a 3-dose immunization series with Td-containing vaccines should begin or complete a primary immunization series including a Tdap dose. Adults should receive a Td booster every 10 years.  Varicella vaccine. An adult without evidence of immunity to varicella should receive 2 doses or a second dose if she has previously received 1 dose. Pregnant females who do not have evidence of immunity should receive the first dose after pregnancy. This first dose should be obtained before leaving the health care facility. The second dose should be obtained 4-8 weeks after the first dose.  Human papillomavirus (HPV) vaccine. Females aged 13-26 years who have not received the vaccine previously should obtain the 3-dose series. The vaccine is not recommended for use in pregnant females. However, pregnancy testing is not needed before receiving a dose. If a female is found to be pregnant after receiving a dose, no treatment is needed. In that case, the remaining doses should be delayed until after the pregnancy. Immunization is recommended for any person with an immunocompromised condition through the age of 24 years if she did not get any or all doses  earlier. During the 3-dose series, the second dose should be obtained 4-8 weeks after the first dose. The third dose should be obtained 24 weeks after the first dose and 16 weeks after the second dose.  Zoster vaccine. One dose is recommended for adults aged 97 years or older unless certain conditions are present.  Measles, mumps, and rubella (MMR) vaccine. Adults born  before 1957 generally are considered immune to measles and mumps. Adults born in [birth year] or later should have 1 or more doses of MMR vaccine unless there is a contraindication to the vaccine or there is laboratory evidence of immunity to each of the three diseases. A routine second dose of MMR vaccine should be obtained at least 28 days after the first dose for students attending postsecondary schools, health care workers, or international travelers. People who received inactivated measles vaccine or an unknown type of measles vaccine during 1963-1967 should receive 2 doses of MMR vaccine. People who received inactivated mumps vaccine or an unknown type of mumps vaccine before 1979 and are at high risk for mumps infection should consider immunization with 2 doses of MMR vaccine. For females of childbearing age, rubella immunity should be determined. If there is no evidence of immunity, females who are not pregnant should be vaccinated. If there is no evidence of immunity, females who are pregnant should delay immunization until after pregnancy. Unvaccinated health care workers born before 60 who lack laboratory evidence of measles, mumps, or rubella immunity or laboratory confirmation of disease should consider measles and mumps immunization with 2 doses of MMR vaccine or rubella immunization with 1 dose of MMR vaccine.  Pneumococcal 13-valent conjugate (PCV13) vaccine. When indicated, a person who is uncertain of his immunization history and has no record of immunization should receive the PCV13 vaccine. All adults 61 years of age and older  should receive this vaccine. An adult aged 92 years or older who has certain medical conditions and has not been previously immunized should receive 1 dose of PCV13 vaccine. This PCV13 should be followed with a dose of pneumococcal polysaccharide (PPSV23) vaccine. Adults who are at high risk for pneumococcal disease should obtain the PPSV23 vaccine at least 8 weeks after the dose of PCV13 vaccine. Adults older than 25 years of age who have normal immune system function should obtain the PPSV23 vaccine dose at least 1 year after the dose of PCV13 vaccine.  Pneumococcal polysaccharide (PPSV23) vaccine. When PCV13 is also indicated, PCV13 should be obtained first. All adults aged 2 years and older should be immunized. An adult younger than age 30 years who has certain medical conditions should be immunized. Any person who resides in a nursing home or long-term care facility should be immunized. An adult smoker should be immunized. People with an immunocompromised condition and certain other conditions should receive both PCV13 and PPSV23 vaccines. People with human immunodeficiency virus (HIV) infection should be immunized as soon as possible after diagnosis. Immunization during chemotherapy or radiation therapy should be avoided. Routine use of PPSV23 vaccine is not recommended for American Indians, [place] Natives, or people younger than 65 years unless there are medical conditions that require PPSV23 vaccine. When indicated, people who have unknown immunization and have no record of immunization should receive PPSV23 vaccine. One-time revaccination 5 years after the first dose of PPSV23 is recommended for people aged 19-64 years who have chronic kidney failure, nephrotic syndrome, asplenia, or immunocompromised conditions. People who received 1-2 doses of PPSV23 before age 44 years should receive another dose of PPSV23 vaccine at age 83 years or later if at least 5 years have passed since the previous dose. Doses  of PPSV23 are not needed for people immunized with PPSV23 at or after age 20 years.  Meningococcal vaccine. Adults with asplenia or persistent complement component deficiencies should receive 2 doses of quadrivalent meningococcal conjugate (MenACWY-D) vaccine. The doses should be obtained  at least 2 months apart. Microbiologists working with certain meningococcal bacteria, [place] recruits, people at risk during an outbreak, and people who travel to or live in countries with a high rate of meningitis should be immunized. A first-year college student up through age 28 years who is living in a residence hall should receive a dose if she did not receive a dose on or after her 16th birthday. Adults who have certain high-risk conditions should receive one or more doses of vaccine.  Hepatitis A vaccine. Adults who wish to be protected from this disease, have certain high-risk conditions, work with hepatitis A-infected animals, work in hepatitis A research labs, or travel to or work in countries with a high rate of hepatitis A should be immunized. Adults who were previously unvaccinated and who anticipate close contact with an international adoptee during the first 60 days after arrival in the [place] from a country with a high rate of hepatitis A should be immunized.  Hepatitis B vaccine. Adults who wish to be protected from this disease, have certain high-risk conditions, may be exposed to blood or other infectious body fluids, are household contacts or sex partners of hepatitis B positive people, are clients or workers in certain care facilities, or travel to or work in countries with a high rate of hepatitis B should be immunized.  Haemophilus influenzae type b (Hib) vaccine. A previously unvaccinated person with asplenia or sickle cell disease or having a scheduled splenectomy should receive 1 dose of Hib vaccine. Regardless of previous immunization, a recipient of a hematopoietic stem cell transplant  should receive a 3-dose series 6-12 months after her successful transplant. Hib vaccine is not recommended for adults with HIV infection. Preventive Services / Frequency Ages 71 to 87 years  Blood pressure check.** / Every 3-5 years.  Lipid and cholesterol check.** / Every 5 years beginning at age 1.  Clinical breast exam.** / Every 3 years for women in their 3s and 31s.  BRCA-related cancer risk assessment.** / For women who have family members with a BRCA-related cancer (breast, ovarian, tubal, or peritoneal cancers).  Pap test.** / Every 2 years from ages 50 through 86. Every 3 years starting at age 87 through age 7 or 75 with a history of 3 consecutive normal Pap tests.  HPV screening.** / Every 3 years from ages 59 through ages 35 to 6 with a history of 3 consecutive normal Pap tests.  Hepatitis C blood test.** / For any individual with known risks for hepatitis C.  Skin self-exam. / Monthly.  Influenza vaccine. / Every year.  Tetanus, diphtheria, and acellular pertussis (Tdap, Td) vaccine.** / Consult your health care provider. Pregnant women should receive 1 dose of Tdap vaccine during each pregnancy. 1 dose of Td every 10 years.  Varicella vaccine.** / Consult your health care provider. Pregnant females who do not have evidence of immunity should receive the first dose after pregnancy.  HPV vaccine. / 3 doses over 6 months, if 72 and younger. The vaccine is not recommended for use in pregnant females. However, pregnancy testing is not needed before receiving a dose.  Measles, mumps, rubella (MMR) vaccine.** / You need at least 1 dose of MMR if you were born in 1957 or later. You may also need a 2nd dose. For females of childbearing age, rubella immunity should be determined. If there is no evidence of immunity, females who are not pregnant should be vaccinated. If there is no evidence of immunity, females who are  pregnant should delay immunization until after  pregnancy.  Pneumococcal 13-valent conjugate (PCV13) vaccine.** / Consult your health care provider.  Pneumococcal polysaccharide (PPSV23) vaccine.** / 1 to 2 doses if you smoke cigarettes or if you have certain conditions.  Meningococcal vaccine.** / 1 dose if you are age 87 to 44 years and a Market researcher living in a residence hall, or have one of several medical conditions, you need to get vaccinated against meningococcal disease. You may also need additional booster doses.  Hepatitis A vaccine.** / Consult your health care provider.  Hepatitis B vaccine.** / Consult your health care provider.  Haemophilus influenzae type b (Hib) vaccine.** / Consult your health care provider. Ages 86 to 38 years  Blood pressure check.** / Every year.  Lipid and cholesterol check.** / Every 5 years beginning at age 49 years.  Lung cancer screening. / Every year if you are aged 71-80 years and have a 30-pack-year history of smoking and currently smoke or have quit within the past 15 years. Yearly screening is stopped once you have quit smoking for at least 15 years or develop a health problem that would prevent you from having lung cancer treatment.  Clinical breast exam.** / Every year after age 51 years.  BRCA-related cancer risk assessment.** / For women who have family members with a BRCA-related cancer (breast, ovarian, tubal, or peritoneal cancers).  Mammogram.** / Every year beginning at age 18 years and continuing for as long as you are in good health. Consult with your health care provider.  Pap test.** / Every 3 years starting at age 63 years through age 37 or 57 years with a history of 3 consecutive normal Pap tests.  HPV screening.** / Every 3 years from ages 41 years through ages 76 to 23 years with a history of 3 consecutive normal Pap tests.  Fecal occult blood test (FOBT) of stool. / Every year beginning at age 36 years and continuing until age 51 years. You may not need  to do this test if you get a colonoscopy every 10 years.  Flexible sigmoidoscopy or colonoscopy.** / Every 5 years for a flexible sigmoidoscopy or every 10 years for a colonoscopy beginning at age 36 years and continuing until age 35 years.  Hepatitis C blood test.** / For all people born from [birth year] through 1965 and any individual with known risks for hepatitis C.  Skin self-exam. / Monthly.  Influenza vaccine. / Every year.  Tetanus, diphtheria, and acellular pertussis (Tdap/Td) vaccine.** / Consult your health care provider. Pregnant women should receive 1 dose of Tdap vaccine during each pregnancy. 1 dose of Td every 10 years.  Varicella vaccine.** / Consult your health care provider. Pregnant females who do not have evidence of immunity should receive the first dose after pregnancy.  Zoster vaccine.** / 1 dose for adults aged 73 years or older.  Measles, mumps, rubella (MMR) vaccine.** / You need at least 1 dose of MMR if you were born in 1957 or later. You may also need a second dose. For females of childbearing age, rubella immunity should be determined. If there is no evidence of immunity, females who are not pregnant should be vaccinated. If there is no evidence of immunity, females who are pregnant should delay immunization until after pregnancy.  Pneumococcal 13-valent conjugate (PCV13) vaccine.** / Consult your health care provider.  Pneumococcal polysaccharide (PPSV23) vaccine.** / 1 to 2 doses if you smoke cigarettes or if you have certain conditions.  Meningococcal vaccine.** /  Consult your health care provider.  Hepatitis A vaccine.** / Consult your health care provider.  Hepatitis B vaccine.** / Consult your health care provider.  Haemophilus influenzae type b (Hib) vaccine.** / Consult your health care provider. Ages 80 years and over  Blood pressure check.** / Every year.  Lipid and cholesterol check.** / Every 5 years beginning at age 62 years.  Lung cancer  screening. / Every year if you are aged 32-80 years and have a 30-pack-year history of smoking and currently smoke or have quit within the past 15 years. Yearly screening is stopped once you have quit smoking for at least 15 years or develop a health problem that would prevent you from having lung cancer treatment.  Clinical breast exam.** / Every year after age 61 years.  BRCA-related cancer risk assessment.** / For women who have family members with a BRCA-related cancer (breast, ovarian, tubal, or peritoneal cancers).  Mammogram.** / Every year beginning at age 39 years and continuing for as long as you are in good health. Consult with your health care provider.  Pap test.** / Every 3 years starting at age 85 years through age 74 or 72 years with 3 consecutive normal Pap tests. Testing can be stopped between 65 and 70 years with 3 consecutive normal Pap tests and no abnormal Pap or HPV tests in the past 10 years.  HPV screening.** / Every 3 years from ages 55 years through ages 67 or 77 years with a history of 3 consecutive normal Pap tests. Testing can be stopped between 65 and 70 years with 3 consecutive normal Pap tests and no abnormal Pap or HPV tests in the past 10 years.  Fecal occult blood test (FOBT) of stool. / Every year beginning at age 81 years and continuing until age 22 years. You may not need to do this test if you get a colonoscopy every 10 years.  Flexible sigmoidoscopy or colonoscopy.** / Every 5 years for a flexible sigmoidoscopy or every 10 years for a colonoscopy beginning at age 67 years and continuing until age 22 years.  Hepatitis C blood test.** / For all people born from [birth year] through 1965 and any individual with known risks for hepatitis C.  Osteoporosis screening.** / A one-time screening for women ages 8 years and over and women at risk for fractures or osteoporosis.  Skin self-exam. / Monthly.  Influenza vaccine. / Every year.  Tetanus, diphtheria, and  acellular pertussis (Tdap/Td) vaccine.** / 1 dose of Td every 10 years.  Varicella vaccine.** / Consult your health care provider.  Zoster vaccine.** / 1 dose for adults aged 56 years or older.  Pneumococcal 13-valent conjugate (PCV13) vaccine.** / Consult your health care provider.  Pneumococcal polysaccharide (PPSV23) vaccine.** / 1 dose for all adults aged 15 years and older.  Meningococcal vaccine.** / Consult your health care provider.  Hepatitis A vaccine.** / Consult your health care provider.  Hepatitis B vaccine.** / Consult your health care provider.  Haemophilus influenzae type b (Hib) vaccine.** / Consult your health care provider. ** Family history and personal history of risk and conditions may change your health care provider's recommendations.   This information is not intended to replace advice given to you by your health care provider. Make sure you discuss any questions you have with your health care provider.   Document Released: 06/07/2001 Document Revised: 05/02/2014 Document Reviewed: 09/06/2010 Elsevier Interactive Patient Education [employer].

## 2016-07-26 ENCOUNTER — Other Ambulatory Visit: Payer: Self-pay | Admitting: Obstetrics and Gynecology

## 2017-01-25 ENCOUNTER — Encounter: Payer: BC Managed Care – PPO | Admitting: Obstetrics and Gynecology

## 2017-03-14 ENCOUNTER — Encounter: Payer: Self-pay | Admitting: Obstetrics and Gynecology

## 2017-03-14 ENCOUNTER — Ambulatory Visit: Payer: BC Managed Care – PPO | Admitting: Obstetrics and Gynecology

## 2017-03-14 VITALS — BP 108/74 | HR 95 | Ht 66.0 in | Wt 189.4 lb

## 2017-03-14 DIAGNOSIS — N926 Irregular menstruation, unspecified: Secondary | ICD-10-CM

## 2017-03-14 LAB — POCT URINE PREGNANCY: Preg Test, Ur: POSITIVE — AB

## 2017-03-14 NOTE — Patient Instructions (Signed)
First Trimester of Pregnancy The first trimester of pregnancy is from week 1 until the end of week 13 (months 1 through 3). A week after a sperm fertilizes an egg, the egg will implant on the wall of the uterus. This embryo will begin to develop into a baby. Genes from you and your partner will form the baby. The female genes will determine whether the baby will be a boy or a girl. At 6-8 weeks, the eyes and face will be formed, and the heartbeat can be seen on ultrasound. At the end of 12 weeks, all the baby's organs will be formed. Now that you are pregnant, you will want to do everything you can to have a healthy baby. Two of the most important things are to get good prenatal care and to follow your health care provider's instructions. Prenatal care is all the medical care you receive before the baby's birth. This care will help prevent, find, and treat any problems during the pregnancy and childbirth. Body changes during your first trimester Your body goes through many changes during pregnancy. The changes vary from woman to woman.  You may gain or lose a couple of pounds at first.  You may feel sick to your stomach (nauseous) and you may throw up (vomit). If the vomiting is uncontrollable, call your health care provider.  You may tire easily.  You may develop headaches that can be relieved by medicines. All medicines should be approved by your health care provider.  You may urinate more often. Painful urination may mean you have a bladder infection.  You may develop heartburn as a result of your pregnancy.  You may develop constipation because certain hormones are causing the muscles that push stool through your intestines to slow down.  You may develop hemorrhoids or swollen veins (varicose veins).  Your breasts may begin to grow larger and become tender. Your nipples may stick out more, and the tissue that surrounds them (areola) may become darker.  Your gums may bleed and may be  sensitive to brushing and flossing.  Dark spots or blotches (chloasma, mask of pregnancy) may develop on your face. This will likely fade after the baby is born.  Your menstrual periods will stop.  You may have a loss of appetite.  You may develop cravings for certain kinds of food.  You may have changes in your emotions from day to day, such as being excited to be pregnant or being concerned that something may go wrong with the pregnancy and baby.  You may have more vivid and strange dreams.  You may have changes in your hair. These can include thickening of your hair, rapid growth, and changes in texture. Some women also have hair loss during or after pregnancy, or hair that feels dry or thin. Your hair will most likely return to normal after your baby is born.  What to expect at prenatal visits During a routine prenatal visit:  You will be weighed to make sure you and the baby are growing normally.  Your blood pressure will be taken.  Your abdomen will be measured to track your baby's growth.  The fetal heartbeat will be listened to between weeks 10 and 14 of your pregnancy.  Test results from any previous visits will be discussed.  Your health care provider may ask you:  How you are feeling.  If you are feeling the baby move.  If you have had any abnormal symptoms, such as leaking fluid, bleeding, severe headaches,   or abdominal cramping.  If you are using any tobacco products, including cigarettes, chewing tobacco, and electronic cigarettes.  If you have any questions.  Other tests that may be performed during your first trimester include:  Blood tests to find your blood type and to check for the presence of any previous infections. The tests will also be used to check for low iron levels (anemia) and protein on red blood cells (Rh antibodies). Depending on your risk factors, or if you previously had diabetes during pregnancy, you may have tests to check for high blood  sugar that affects pregnant women (gestational diabetes).  Urine tests to check for infections, diabetes, or protein in the urine.  An ultrasound to confirm the proper growth and development of the baby.  Fetal screens for spinal cord problems (spina bifida) and Down syndrome.  HIV (human immunodeficiency virus) testing. Routine prenatal testing includes screening for HIV, unless you choose not to have this test.  You may need other tests to make sure you and the baby are doing well.  Follow these instructions at home: Medicines  Follow your health care provider's instructions regarding medicine use. Specific medicines may be either safe or unsafe to take during pregnancy.  Take a prenatal vitamin that contains at least 600 micrograms (mcg) of folic acid.  If you develop constipation, try taking a stool softener if your health care provider approves. Eating and drinking  Eat a balanced diet that includes fresh fruits and vegetables, whole grains, good sources of protein such as meat, eggs, or tofu, and low-fat dairy. Your health care provider will help you determine the amount of weight gain that is right for you.  Avoid raw meat and uncooked cheese. These carry germs that can cause birth defects in the baby.  Eating four or five small meals rather than three large meals a day may help relieve nausea and vomiting. If you start to feel nauseous, eating a few soda crackers can be helpful. Drinking liquids between meals, instead of during meals, also seems to help ease nausea and vomiting.  Limit foods that are high in fat and processed sugars, such as fried and sweet foods.  To prevent constipation: ? Eat foods that are high in fiber, such as fresh fruits and vegetables, whole grains, and beans. ? Drink enough fluid to keep your urine clear or pale yellow. Activity  Exercise only as directed by your health care provider. Most women can continue their usual exercise routine during  pregnancy. Try to exercise for 30 minutes at least 5 days a week. Exercising will help you: ? Control your weight. ? Stay in shape. ? Be prepared for labor and delivery.  Experiencing pain or cramping in the lower abdomen or lower back is a good sign that you should stop exercising. Check with your health care provider before continuing with normal exercises.  Try to avoid standing for long periods of time. Move your legs often if you must stand in one place for a long time.  Avoid heavy lifting.  Wear low-heeled shoes and practice good posture.  You may continue to have sex unless your health care provider tells you not to. Relieving pain and discomfort  Wear a good support bra to relieve breast tenderness.  Take warm sitz baths to soothe any pain or discomfort caused by hemorrhoids. Use hemorrhoid cream if your health care provider approves.  Rest with your legs elevated if you have leg cramps or low back pain.  If you develop   varicose veins in your legs, wear support hose. Elevate your feet for 15 minutes, 3-4 times a day. Limit salt in your diet. Prenatal care  Schedule your prenatal visits by the twelfth week of pregnancy. They are usually scheduled monthly at first, then more often in the last 2 months before delivery.  Write down your questions. Take them to your prenatal visits.  Keep all your prenatal visits as told by your health care provider. This is important. Safety  Wear your seat belt at all times when driving.  Make a list of emergency phone numbers, including numbers for family, friends, the hospital, and police and fire departments. General instructions  Ask your health care provider for a referral to a local prenatal education class. Begin classes no later than the beginning of month 6 of your pregnancy.  Ask for help if you have counseling or nutritional needs during pregnancy. Your health care provider can offer advice or refer you to specialists for help  with various needs.  Do not use hot tubs, steam rooms, or saunas.  Do not douche or use tampons or scented sanitary pads.  Do not cross your legs for long periods of time.  Avoid cat litter boxes and soil used by cats. These carry germs that can cause birth defects in the baby and possibly loss of the fetus by miscarriage or stillbirth.  Avoid all smoking, herbs, alcohol, and medicines not prescribed by your health care provider. Chemicals in these products affect the formation and growth of the baby.  Do not use any products that contain nicotine or tobacco, such as cigarettes and e-cigarettes. If you need help quitting, ask your health care provider. You may receive counseling support and other resources to help you quit.  Schedule a dentist appointment. At home, brush your teeth with a soft toothbrush and be gentle when you floss. Contact a health care provider if:  You have dizziness.  You have mild pelvic cramps, pelvic pressure, or nagging pain in the abdominal area.  You have persistent nausea, vomiting, or diarrhea.  You have a bad smelling vaginal discharge.  You have pain when you urinate.  You notice increased swelling in your face, hands, legs, or ankles.  You are exposed to fifth disease or chickenpox.  You are exposed to German measles (rubella) and have never had it. Get help right away if:  You have a fever.  You are leaking fluid from your vagina.  You have spotting or bleeding from your vagina.  You have severe abdominal cramping or pain.  You have rapid weight gain or loss.  You vomit blood or material that looks like coffee grounds.  You develop a severe headache.  You have shortness of breath.  You have any kind of trauma, such as from a fall or a car accident. Summary  The first trimester of pregnancy is from week 1 until the end of week 13 (months 1 through 3).  Your body goes through many changes during pregnancy. The changes vary from  woman to woman.  You will have routine prenatal visits. During those visits, your health care provider will examine you, discuss any test results you may have, and talk with you about how you are feeling. This information is not intended to replace advice given to you by your health care provider. Make sure you discuss any questions you have with your health care provider. Document Released: 04/05/2001 Document Revised: 03/23/2016 Document Reviewed: 03/23/2016 Elsevier Interactive Patient Education  2017 Elsevier   Inc.  

## 2017-03-14 NOTE — Progress Notes (Signed)
Subjective:     Patient ID: Erica Gibson, female   DOB: 02-Jan-1991, 26 y.o.   MRN: [id number]  HPI Here for pregnancy confirmation with LMP 02/03/17, giving EDC 11/10/17 and EGA 5w 4d. G2P1001,   Review of Systems Negative except stated above in HPI    Objective:   Physical Exam A&Ox4 Well groomed female in no distress Blood pressure 108/74, pulse 95, height 5\' 6"  (1.676 m), weight 189 lb 6.4 oz (85.9 kg), last menstrual period 02/03/2017, not currently breastfeeding. Body mass index is 30.57 kg/m.  UPT+     Assessment:     Missed menses    Plan:     Reviewed physical changes in 1st trimester RTC in 2 weeks for viability scan and then 6 weeks for NOB PE with me, sooner if concerns arise.  Melody Shambley,CNM

## 2017-03-28 ENCOUNTER — Ambulatory Visit (INDEPENDENT_AMBULATORY_CARE_PROVIDER_SITE_OTHER): Payer: BC Managed Care – PPO

## 2017-03-28 DIAGNOSIS — N926 Irregular menstruation, unspecified: Secondary | ICD-10-CM

## 2017-03-30 ENCOUNTER — Encounter: Payer: BC Managed Care – PPO | Admitting: Obstetrics and Gynecology

## 2017-04-06 ENCOUNTER — Encounter: Payer: BC Managed Care – PPO | Admitting: Obstetrics and Gynecology

## 2017-04-07 ENCOUNTER — Ambulatory Visit: Payer: BC Managed Care – PPO | Admitting: Obstetrics and Gynecology

## 2017-04-07 VITALS — BP 102/68 | HR 98 | Ht 66.0 in | Wt 189.1 lb

## 2017-04-07 DIAGNOSIS — Z3481 Encounter for supervision of other normal pregnancy, first trimester: Secondary | ICD-10-CM

## 2017-04-07 NOTE — Progress Notes (Signed)
Vira Blancohandler N Coulson presents for NOB nurse interview visit. Pregnancy confirmation done ___[facility]___.  G- 2.  P-  1  . Pregnancy education material explained and given. __0_ cats in the home. NOB labs ordered. (TSH/HbgA1c due to Increased BMI),. HIV labs and Drug screen were explained optional and she did not decline. Drug screen ordered. PNV encouraged. Genetic screening options discussed. Genetic testing: Ordered.  Pt may discuss with provider. Pt. To follow up with provider in _4_ weeks for NOB physical.  All questions answered.

## 2017-04-08 LAB — URINALYSIS, ROUTINE W REFLEX MICROSCOPIC
BILIRUBIN UA: NEGATIVE
Glucose, UA: NEGATIVE
KETONES UA: NEGATIVE
Nitrite, UA: POSITIVE — AB
PROTEIN UA: NEGATIVE
RBC UA: NEGATIVE
SPEC GRAV UA: 1.025 (ref 1.005–1.030)
UUROB: 0.2 mg/dL (ref 0.2–1.0)
pH, UA: 6.5 (ref 5.0–7.5)

## 2017-04-08 LAB — CBC WITH DIFFERENTIAL
BASOS: 0 %
Basophils Absolute: 0 10*3/uL (ref 0.0–0.2)
EOS (ABSOLUTE): 0.1 10*3/uL (ref 0.0–0.4)
EOS: 1 %
HEMATOCRIT: 40.8 % (ref 34.0–46.6)
HEMOGLOBIN: 14.1 g/dL (ref 11.1–15.9)
IMMATURE GRANULOCYTES: 0 %
Immature Grans (Abs): 0 10*3/uL (ref 0.0–0.1)
LYMPHS ABS: 1.6 10*3/uL (ref 0.7–3.1)
Lymphs: 25 %
MCH: 30.2 pg (ref 26.6–33.0)
MCHC: 34.6 g/dL (ref 31.5–35.7)
MCV: 87 fL (ref 79–97)
MONOCYTES: 4 %
MONOS ABS: 0.3 10*3/uL (ref 0.1–0.9)
NEUTROS ABS: 4.4 10*3/uL (ref 1.4–7.0)
Neutrophils: 70 %
RBC: 4.67 x10E6/uL (ref 3.77–5.28)
RDW: 12.9 % (ref 12.3–15.4)
WBC: 6.3 10*3/uL (ref 3.4–10.8)

## 2017-04-08 LAB — ABO AND RH: Rh Factor: POSITIVE

## 2017-04-08 LAB — HEPATITIS B SURFACE ANTIGEN: Hepatitis B Surface Ag: NEGATIVE

## 2017-04-08 LAB — VARICELLA ZOSTER ANTIBODY, IGG: VARICELLA: 444 {index} (ref >165)

## 2017-04-08 LAB — HIV ANTIBODY (ROUTINE TESTING W REFLEX): HIV SCREEN 4TH GENERATION: NONREACTIVE

## 2017-04-08 LAB — RPR: RPR Ser Ql: NONREACTIVE

## 2017-04-08 LAB — TSH: TSH: 0.772 u[IU]/mL (ref 0.450–4.500)

## 2017-04-08 LAB — MICROSCOPIC EXAMINATION
Casts: NONE SEEN /lpf
Epithelial Cells (non renal): >10 /hpf — AB (ref 0–10)
RBC, UA: NONE SEEN /hpf (ref 0–?)

## 2017-04-08 LAB — ANTIBODY SCREEN: ANTIBODY SCREEN: NEGATIVE

## 2017-04-08 LAB — RUBELLA SCREEN: Rubella Antibodies, IGG: 6.49 index (ref >0.99)

## 2017-04-08 LAB — HEMOGLOBIN A1C
Est. average glucose Bld gHb Est-mCnc: 103 mg/dL
HEMOGLOBIN A1C: 5.2 % (ref 4.8–5.6)

## 2017-04-09 LAB — URINE CULTURE: ORGANISM ID, BACTERIA: NO GROWTH

## 2017-04-10 LAB — GC/CHLAMYDIA PROBE AMP
CHLAMYDIA, DNA PROBE: NEGATIVE
Neisseria gonorrhoeae by PCR: NEGATIVE

## 2017-04-11 ENCOUNTER — Telehealth: Payer: Self-pay | Admitting: *Deleted

## 2017-04-11 ENCOUNTER — Other Ambulatory Visit (INDEPENDENT_AMBULATORY_CARE_PROVIDER_SITE_OTHER): Payer: BC Managed Care – PPO | Admitting: Obstetrics and Gynecology

## 2017-04-11 DIAGNOSIS — R300 Dysuria: Secondary | ICD-10-CM | POA: Diagnosis not present

## 2017-04-11 LAB — POCT URINALYSIS DIPSTICK
Bilirubin, UA: NEGATIVE
Blood, UA: NEGATIVE
Glucose, UA: NEGATIVE
KETONES UA: NEGATIVE
Leukocytes, UA: NEGATIVE
NITRITE UA: NEGATIVE
PROTEIN UA: NEGATIVE
Spec Grav, UA: 1.015 (ref 1.010–1.025)
UROBILINOGEN UA: 0.2 U/dL
pH, UA: 6 (ref 5.0–8.0)

## 2017-04-11 NOTE — Telephone Encounter (Signed)
-----   Message from Purcell NailsMelody N Shambley, [place] sent at 04/11/2017  9:10 AM EST ----- Please have her drop off another urine for dip and culture

## 2017-04-11 NOTE — Telephone Encounter (Signed)
Notified pt she will come by the office 04/11/17 for urine drop off.

## 2017-04-13 LAB — URINE CULTURE: ORGANISM ID, BACTERIA: NO GROWTH

## 2017-04-20 LAB — MONITOR DRUG PROFILE 14(MW)

## 2017-04-20 LAB — NICOTINE SCREEN, URINE

## 2017-04-24 ENCOUNTER — Telehealth: Payer: Self-pay | Admitting: Obstetrics and Gynecology

## 2017-04-24 ENCOUNTER — Other Ambulatory Visit: Payer: Self-pay | Admitting: *Deleted

## 2017-04-24 MED ORDER — ONDANSETRON 4 MG PO TBDP: 4.0000 mg | ORAL_TABLET | Freq: Three times a day (TID) | ORAL | 3 refills | Status: DC | PRN

## 2017-04-25 NOTE — L&D Delivery Note (Signed)
Delivery Note:  1800 In room to see patient. SVE: 10/100/+2, vertex. Effective maternal pushing efforts noted.   Spontaneous vaginal birth of liveborn female patient in left occiput anterior position, single nuchal and body cord reduced via somersaulting technique at 1822. Infant immediately to maternal abdomen. Delayed cord clamping. Three (3) vessel cord. Cord blood collected. APGARS: 8, 9. Weight pending. Receiving nurse present at bedside for birth.   Pitocin infusing. Spontaneous delivery of intact placenta at 1826. No lacerations. Adequate epidural anesthesia. EBL: 200 ml. Counts correct x 2. Vault check completed.   Initiate routine postpartum care and orders. Mom to postpartum.  Baby to Couplet care / Skin to Skin.  FOB at bedside and overjoyed with the birth of "Landon".    Gunnar BullaJenkins Michelle Caldonia Leap, CNM [facility], [facility] 11/06/2017, 6:46 PM

## 2017-04-28 ENCOUNTER — Encounter: Payer: Self-pay | Admitting: Obstetrics and Gynecology

## 2017-04-28 ENCOUNTER — Ambulatory Visit (INDEPENDENT_AMBULATORY_CARE_PROVIDER_SITE_OTHER): Payer: BC Managed Care – PPO | Admitting: Obstetrics and Gynecology

## 2017-04-28 VITALS — BP 98/62 | HR 90 | Wt 184.5 lb

## 2017-04-28 DIAGNOSIS — Z3492 Encounter for supervision of normal pregnancy, unspecified, second trimester: Secondary | ICD-10-CM

## 2017-04-28 LAB — POCT URINALYSIS DIPSTICK
Bilirubin, UA: NEGATIVE
Blood, UA: NEGATIVE
Glucose, UA: NEGATIVE
KETONES UA: NEGATIVE
Leukocytes, UA: NEGATIVE
NITRITE UA: NEGATIVE
PROTEIN UA: NEGATIVE
Spec Grav, UA: 1.01 (ref 1.010–1.025)
Urobilinogen, UA: 0.2 E.U./dL
pH, UA: 6 (ref 5.0–8.0)

## 2017-04-28 NOTE — Progress Notes (Signed)
NOB physical- pt is doing ok, her and her family have had stomach virus, head colds, she is doing better

## 2017-04-28 NOTE — Progress Notes (Signed)
NEW OB HISTORY AND PHYSICAL  SUBJECTIVE:       Erica Gibson is a 27 y.o. 442P1001 female, Patient's last menstrual period was 02/03/2017 (exact date)., Estimated Date of Delivery: 11/10/17, 7035w0d, presents today for establishment of Prenatal Care. She has no unusual complaints and complains of diarrhea & stomach bug      Gynecologic History Patient's last menstrual period was 02/03/2017 (exact date). Normal Contraception: none Last Pap: 2017. Results were: normal  Obstetric History OB History  Gravida Para Term Preterm AB Living  2 1 1     1   SAB TAB Ectopic Multiple Live Births        0 1    # Outcome Date GA Lbr Len/2nd Weight Sex Delivery Anes PTL Lv  2 Current           1 Term 07/08/15 4078w3d / 01:49  F Vag-Spont EPI  LIV      Past Medical History:  Diagnosis Date  . Gestational diabetes     Past Surgical History:  Procedure Laterality Date  . NO PAST SURGERIES      Current Outpatient Medications on File Prior to Visit  Medication Sig Dispense Refill  . Prenatal Vit-Fe Fumarate-FA (PRENATAL MULTIVITAMIN) TABS tablet Take 1 tablet daily at 12 noon by mouth.    . ondansetron (ZOFRAN ODT) 4 MG disintegrating tablet Take 1 tablet (4 mg total) by mouth every 8 (eight) hours as needed for nausea or vomiting. (Patient not taking: Reported on 04/28/2017) 20 tablet 3   No current facility-administered medications on file prior to visit.     No Known Allergies  Social History   Socioeconomic History  . Marital status: Single    Spouse name: Not on file  . Number of children: Not on file  . Years of education: Not on file  . Highest education level: Not on file  Social Needs  . Financial resource strain: Not on file  . Food insecurity - worry: Not on file  . Food insecurity - inability: Not on file  . Transportation needs - medical: Not on file  . Transportation needs - non-medical: Not on file  Occupational History  . Not on file  Tobacco Use  . Smoking  status: Never Smoker  . Smokeless tobacco: Never Used  Substance and Sexual Activity  . Alcohol use: No  . Drug use: No  . Sexual activity: Yes    Birth control/protection: None, Pill  Other Topics Concern  . Not on file  Social History Narrative  . Not on file    Family History  Problem Relation Age of Onset  . Hyperlipidemia Father     The following portions of the patient's history were reviewed and updated as appropriate: allergies, current medications, past OB history, past medical history, past surgical history, past family history, past social history, and problem list.    OBJECTIVE: Initial Physical Exam (New OB)  GENERAL APPEARANCE: alert, well appearing, in no apparent distress, oriented to person, place and time HEAD: normocephalic, atraumatic MOUTH: mucous membranes moist, pharynx normal without lesions and dental hygiene good THYROID: no thyromegaly or masses present BREASTS: not examined LUNGS: clear to auscultation, no wheezes, rales or rhonchi, symmetric air entry HEART: regular rate and rhythm, no murmurs ABDOMEN: soft, nontender, nondistended, no abnormal masses, no epigastric pain and FHT present EXTREMITIES: no redness or tenderness in the calves or thighs SKIN: normal coloration and turgor, no rashes LYMPH NODES: no adenopathy palpable NEUROLOGIC: alert, oriented, normal speech,  no focal findings or movement disorder noted  PELVIC EXAM deferred  ASSESSMENT: Normal pregnancy  PLAN: Prenatal care Genetic screening obtained today Early glucola next visit. See orders

## 2017-04-28 NOTE — Patient Instructions (Signed)
Second Trimester of Pregnancy The second trimester is from week 13 through week 28, month 4 through 6. This is often the time in pregnancy that you feel your best. Often times, morning sickness has lessened or quit. You may have more energy, and you may get hungry more often. Your unborn baby (fetus) is growing rapidly. At the end of the sixth month, he or she is about 9 inches long and weighs about 1 pounds. You will likely feel the baby move (quickening) between 18 and 20 weeks of pregnancy. Follow these instructions at home:  Avoid all smoking, herbs, and alcohol. Avoid drugs not approved by your doctor.  Do not use any tobacco products, including cigarettes, chewing tobacco, and electronic cigarettes. If you need help quitting, ask your doctor. You may get counseling or other support to help you quit.  Only take medicine as told by your doctor. Some medicines are safe and some are not during pregnancy.  Exercise only as told by your doctor. Stop exercising if you start having cramps.  Eat regular, healthy meals.  Wear a good support bra if your breasts are tender.  Do not use hot tubs, steam rooms, or saunas.  Wear your seat belt when driving.  Avoid raw meat, uncooked cheese, and liter boxes and soil used by cats.  Take your prenatal vitamins.  Take 1500-2000 milligrams of calcium daily starting at the 20th week of pregnancy until you deliver your baby.  Try taking medicine that helps you poop (stool softener) as needed, and if your doctor approves. Eat more fiber by eating fresh fruit, vegetables, and whole grains. Drink enough fluids to keep your pee (urine) clear or pale yellow.  Take warm water baths (sitz baths) to soothe pain or discomfort caused by hemorrhoids. Use hemorrhoid cream if your doctor approves.  If you have puffy, bulging veins (varicose veins), wear support hose. Raise (elevate) your feet for 15 minutes, 3-4 times a day. Limit salt in your diet.  Avoid heavy  lifting, wear low heals, and sit up straight.  Rest with your legs raised if you have leg cramps or low back pain.  Visit your dentist if you have not gone during your pregnancy. Use a soft toothbrush to brush your teeth. Be gentle when you floss.  You can have sex (intercourse) unless your doctor tells you not to.  Go to your doctor visits. Get help if:  You feel dizzy.  You have mild cramps or pressure in your lower belly (abdomen).  You have a nagging pain in your belly area.  You continue to feel sick to your stomach (nauseous), throw up (vomit), or have watery poop (diarrhea).  You have bad smelling fluid coming from your vagina.  You have pain with peeing (urination). Get help right away if:  You have a fever.  You are leaking fluid from your vagina.  You have spotting or bleeding from your vagina.  You have severe belly cramping or pain.  You lose or gain weight rapidly.  You have trouble catching your breath and have chest pain.  You notice sudden or extreme puffiness (swelling) of your face, hands, ankles, feet, or legs.  You have not felt the baby move in over an hour.  You have severe headaches that do not go away with medicine.  You have vision changes. This information is not intended to replace advice given to you by your health care provider. Make sure you discuss any questions you have with your health care   provider. Document Released: 07/06/2009 Document Revised: 09/17/2015 Document Reviewed: 06/12/2012 Elsevier Interactive Patient Education  2017 Elsevier Inc.  

## 2017-05-26 ENCOUNTER — Other Ambulatory Visit: Payer: BC Managed Care – PPO

## 2017-05-26 ENCOUNTER — Ambulatory Visit (INDEPENDENT_AMBULATORY_CARE_PROVIDER_SITE_OTHER): Payer: BC Managed Care – PPO | Admitting: Certified Nurse Midwife

## 2017-05-26 ENCOUNTER — Encounter: Payer: Self-pay | Admitting: Certified Nurse Midwife

## 2017-05-26 VITALS — BP 104/74 | HR 87 | Wt 190.1 lb

## 2017-05-26 DIAGNOSIS — Z3492 Encounter for supervision of normal pregnancy, unspecified, second trimester: Secondary | ICD-10-CM

## 2017-05-26 DIAGNOSIS — Z1379 Encounter for other screening for genetic and chromosomal anomalies: Secondary | ICD-10-CM

## 2017-05-26 DIAGNOSIS — Z8632 Personal history of gestational diabetes: Secondary | ICD-10-CM | POA: Insufficient documentation

## 2017-05-26 LAB — POCT URINALYSIS DIPSTICK
BILIRUBIN UA: NEGATIVE
Blood, UA: NEGATIVE
GLUCOSE UA: NEGATIVE
Ketones, UA: NEGATIVE
LEUKOCYTES UA: NEGATIVE
Nitrite, UA: NEGATIVE
Protein, UA: NEGATIVE
Spec Grav, UA: 1.01 (ref 1.010–1.025)
Urobilinogen, UA: 0.2 E.U./dL
pH, UA: 6.5 (ref 5.0–8.0)

## 2017-05-26 NOTE — Progress Notes (Addendum)
ROB-Doing well, early glucola today due to history of gestational diabetes in last pregnancy. Low risk female, named Erica Gibson. Desires 2nd trimester screen today, see orders. Reviewed red flag symptoms and when to call. RTC x 4 weeks for Anatomy scan and ROB or sooner if needed.

## 2017-05-26 NOTE — Patient Instructions (Signed)
Common Medications Safe in Pregnancy  Acne:      Constipation:  Benzoyl Peroxide     Colace  Clindamycin      Dulcolax Suppository  Topica Erythromycin     Fibercon  Salicylic Acid      Metamucil         Miralax AVOID:        Senakot   Accutane    Cough:  Retin-A       Cough Drops  Tetracycline      Phenergan w/ Codeine if Rx  Minocycline      Robitussin (Plain & DM)  Antibiotics:     Crabs/Lice:  Ceclor       RID  Cephalosporins    AVOID:  E-Mycins      Kwell  Keflex  Macrobid/Macrodantin   Diarrhea:  Penicillin      Kao-Pectate  Zithromax      Imodium AD         PUSH FLUIDS AVOID:       Cipro     Fever:  Tetracycline      Tylenol (Regular or Extra  Minocycline       Strength)  Levaquin      Extra Strength-Do not          Exceed 8 tabs/24 hrs Caffeine:        <200mg/day (equiv. To 1 cup of coffee or  approx. 3 12 oz sodas)         Gas: Cold/Hayfever:       Gas-X  Benadryl      Mylicon  Claritin       Phazyme  **Claritin-D        Chlor-Trimeton    Headaches:  Dimetapp      ASA-Free Excedrin  Drixoral-Non-Drowsy     Cold Compress  Mucinex (Guaifenasin)     Tylenol (Regular or Extra  Sudafed/Sudafed-12 Hour     Strength)  **Sudafed PE Pseudoephedrine   Tylenol Cold & Sinus     Vicks Vapor Rub  Zyrtec  **AVOID if Problems With Blood Pressure         Heartburn: Avoid lying down for at least 1 hour after meals  Aciphex      Maalox     Rash:  Milk of Magnesia     Benadryl    Mylanta       1% Hydrocortisone Cream  Pepcid  Pepcid Complete   Sleep Aids:  Prevacid      Ambien   Prilosec       Benadryl  Rolaids       Chamomile Tea  Tums (Limit 4/day)     Unisom  Zantac       Tylenol PM         Warm milk-add vanilla or  Hemorrhoids:       Sugar for taste  Anusol/Anusol H.C.  (RX: Analapram 2.5%)  Sugar Substitutes:  Hydrocortisone OTC     Ok in moderation  Preparation H      Tucks        Vaseline lotion applied to tissue with  wiping    Herpes:     Throat:  Acyclovir      Oragel  Famvir  Valtrex     Vaccines:         Flu Shot Leg Cramps:       *Gardasil  Benadryl      Hepatitis A         Hepatitis B Nasal Spray:         Pneumovax  Saline Nasal Spray     Polio Booster         Tetanus Nausea:       Tuberculosis test or PPD  Vitamin B6 25 mg TID   AVOID:    Dramamine      *Gardasil  Emetrol       Live Poliovirus  Ginger Root 250 mg QID    MMR (measles, mumps &  High Complex Carbs @ Bedtime    rebella)  Sea Bands-Accupressure    Varicella (Chickenpox)  Unisom 1/2 tab TID     *No known complications           If received before Pain:         Known pregnancy;   Darvocet       Resume series after  Lortab        Delivery  Percocet    Yeast:   Tramadol      Femstat  Tylenol 3      Gyne-lotrimin  Ultram       Monistat  Vicodin           MISC:         All Sunscreens           Hair Coloring/highlights          Insect Repellant's          (Including DEET)         Mystic Tans Abdominal Pain During Pregnancy Belly (abdominal) pain is common during pregnancy. Most of the time, it is not a serious problem. Other times, it can be a sign that something is wrong with the pregnancy. Always tell your doctor if you have belly pain. Follow these instructions at home: Monitor your belly pain for any changes. The following actions may help you feel better:  Do not have sex (intercourse) or put anything in your vagina until you feel better.  Rest until your pain stops.  Drink clear fluids if you feel sick to your stomach (nauseous). Do not eat solid food until you feel better.  Only take medicine as told by your doctor.  Keep all doctor visits as told.  Get help right away if:  You are bleeding, leaking fluid, or pieces of tissue come out of your vagina.  You have more pain or cramping.  You keep throwing up (vomiting).  You have pain when you pee (urinate) or have blood in your pee.  You have a  fever.  You do not feel your baby moving as much.  You feel very weak or feel like passing out.  You have trouble breathing, with or without belly pain.  You have a very bad headache and belly pain.  You have fluid leaking from your vagina and belly pain.  You keep having watery poop (diarrhea).  Your belly pain does not go away after resting, or the pain gets worse. This information is not intended to replace advice given to you by your health care provider. Make sure you discuss any questions you have with your health care provider. Document Released: 03/30/2009 Document Revised: 11/18/2015 Document Reviewed: 11/08/2012 Elsevier Interactive Patient Education  2018 Elsevier Inc. Back Pain in Pregnancy Back pain during pregnancy is common. Back pain may be caused by several factors that are related to changes during your pregnancy. Follow these instructions at home: Managing pain, stiffness, and swelling  If directed, apply ice for sudden (acute) back pain. ? Put ice in a plastic bag. ? Place   a towel between your skin and the bag. ? Leave the ice on for 20 minutes, 2-3 times per day.  If directed, apply heat to the affected area before you exercise: ? Place a towel between your skin and the heat pack or heating pad. ? Leave the heat on for 20-30 minutes. ? Remove the heat if your skin turns bright red. This is especially important if you are unable to feel pain, heat, or cold. You may have a greater risk of getting burned. Activity  Exercise as told by your health care provider. Exercising is the best way to prevent or manage back pain.  Listen to your body when lifting. If lifting hurts, ask for help or bend your knees. This uses your leg muscles instead of your back muscles.  Squat down when picking up something from the floor. Do not bend over.  Only use bed rest as told by your health care provider. Bed rest should only be used for the most severe episodes of back  pain. Standing, Sitting, and Lying Down  Do not stand in one place for long periods of time.  Use good posture when sitting. Make sure your head rests over your shoulders and is not hanging forward. Use a pillow on your lower back if necessary.  Try sleeping on your side, preferably the left side, with a pillow or two between your legs. If you are sore after a night's rest, your bed may be too soft. A firm mattress may provide more support for your back during pregnancy. General instructions  Do not wear high heels.  Eat a healthy diet. Try to gain weight within your health care provider's recommendations.  Use a maternity girdle, elastic sling, or back brace as told by your health care provider.  Take over-the-counter and prescription medicines only as told by your health care provider.  Keep all follow-up visits as told by your health care provider. This is important. This includes any visits with any specialists, such as a physical therapist. Contact a health care provider if:  Your back pain interferes with your daily activities.  You have increasing pain in other parts of your body. Get help right away if:  You develop numbness, tingling, weakness, or problems with the use of your arms or legs.  You develop severe back pain that is not controlled with medicine.  You have a sudden change in bowel or bladder control.  You develop shortness of breath, dizziness, or you faint.  You develop nausea, vomiting, or sweating.  You have back pain that is a rhythmic, cramping pain similar to labor pains. Labor pain is usually 1-2 minutes apart, lasts for about 1 minute, and involves a bearing down feeling or pressure in your pelvis.  You have back pain and your water breaks or you have vaginal bleeding.  You have back pain or numbness that travels down your leg.  Your back pain developed after you fell.  You develop pain on one side of your back.  You see blood in your  urine.  You develop skin blisters in the area of your back pain. This information is not intended to replace advice given to you by your health care provider. Make sure you discuss any questions you have with your health care provider. Document Released: 07/20/2005 Document Revised: 09/17/2015 Document Reviewed: 12/24/2014 Elsevier Interactive Patient Education  2018 [place] of Pregnancy The second trimester is from week 13 through week 28, month 4 through 6. This  is often the time in pregnancy that you feel your best. Often times, morning sickness has lessened or quit. You may have more energy, and you may get hungry more often. Your unborn baby (fetus) is growing rapidly. At the end of the sixth month, he or she is about 9 inches long and weighs about 1 pounds. You will likely feel the baby move (quickening) between 18 and 20 weeks of pregnancy. Follow these instructions at home:  Avoid all smoking, herbs, and alcohol. Avoid drugs not approved by your doctor.  Do not use any tobacco products, including cigarettes, chewing tobacco, and electronic cigarettes. If you need help quitting, ask your doctor. You may get counseling or other support to help you quit.  Only take medicine as told by your doctor. Some medicines are safe and some are not during pregnancy.  Exercise only as told by your doctor. Stop exercising if you start having cramps.  Eat regular, healthy meals.  Wear a good support bra if your breasts are tender.  Do not use hot tubs, steam rooms, or saunas.  Wear your seat belt when driving.  Avoid raw meat, uncooked cheese, and liter boxes and soil used by cats.  Take your prenatal vitamins.  Take 1500-2000 milligrams of calcium daily starting at the 20th week of pregnancy until you deliver your baby.  Try taking medicine that helps you poop (stool softener) as needed, and if your doctor approves. Eat more fiber by eating fresh fruit, vegetables,  and whole grains. Drink enough fluids to keep your pee (urine) clear or pale yellow.  Take warm water baths (sitz baths) to soothe pain or discomfort caused by hemorrhoids. Use hemorrhoid cream if your doctor approves.  If you have puffy, bulging veins (varicose veins), wear support hose. Raise (elevate) your feet for 15 minutes, 3-4 times a day. Limit salt in your diet.  Avoid heavy lifting, wear low heals, and sit up straight.  Rest with your legs raised if you have leg cramps or low back pain.  Visit your dentist if you have not gone during your pregnancy. Use a soft toothbrush to brush your teeth. Be gentle when you floss.  You can have sex (intercourse) unless your doctor tells you not to.  Go to your doctor visits. Get help if:  You feel dizzy.  You have mild cramps or pressure in your lower belly (abdomen).  You have a nagging pain in your belly area.  You continue to feel sick to your stomach (nauseous), throw up (vomit), or have watery poop (diarrhea).  You have bad smelling fluid coming from your vagina.  You have pain with peeing (urination). Get help right away if:  You have a fever.  You are leaking fluid from your vagina.  You have spotting or bleeding from your vagina.  You have severe belly cramping or pain.  You lose or gain weight rapidly.  You have trouble catching your breath and have chest pain.  You notice sudden or extreme puffiness (swelling) of your face, hands, ankles, feet, or legs.  You have not felt the baby move in over an hour.  You have severe headaches that do not go away with medicine.  You have vision changes. This information is not intended to replace advice given to you by your health care provider. Make sure you discuss any questions you have with your health care provider. Document Released: 07/06/2009 Document Revised: 09/17/2015 Document Reviewed: 06/12/2012 Elsevier Interactive Patient Education  2017 [employer].

## 2017-05-26 NOTE — Progress Notes (Signed)
ROB- pt is doing early glucola, pt is doing well

## 2017-05-27 LAB — GLUCOSE, 1 HOUR GESTATIONAL: Gestational Diabetes Screen: 117 mg/dL (ref 65–139)

## 2017-05-31 LAB — AFP TETRA
DIA Mom Value: 0.68
DIA VALUE (EIA): 102.04 pg/mL
DSR (By Age)    1 IN: 918
DSR (SECOND TRIMESTER) 1 IN: 10000
GESTATIONAL AGE AFP: 16 wk
MATERNAL AGE AT EDD: 27.1 a
MSAFP MOM: 1.14
MSAFP: 31.4 ng/mL
MSHCG Mom: 0.51
MSHCG: 17287 m[IU]/mL
OSB RISK: 7850
T18 (By Age): 1:3576 {titer}
Test Results:: NEGATIVE
UE3 MOM: 1.18
WEIGHT: 190 [lb_av]
uE3 Value: 0.88 ng/mL

## 2017-06-06 NOTE — Progress Notes (Signed)
Please contact patient. Second trimester screen negative or low risk. Please encourage patient to activate MyChart. Thanks, JML

## 2017-06-15 ENCOUNTER — Other Ambulatory Visit: Payer: Self-pay | Admitting: Certified Nurse Midwife

## 2017-06-15 DIAGNOSIS — Z3689 Encounter for other specified antenatal screening: Secondary | ICD-10-CM

## 2017-06-27 ENCOUNTER — Ambulatory Visit (INDEPENDENT_AMBULATORY_CARE_PROVIDER_SITE_OTHER): Payer: BC Managed Care – PPO

## 2017-06-27 ENCOUNTER — Encounter: Payer: Self-pay | Admitting: Certified Nurse Midwife

## 2017-06-27 ENCOUNTER — Ambulatory Visit (INDEPENDENT_AMBULATORY_CARE_PROVIDER_SITE_OTHER): Payer: BC Managed Care – PPO | Admitting: Certified Nurse Midwife

## 2017-06-27 VITALS — BP 102/61 | HR 89 | Wt 195.6 lb

## 2017-06-27 DIAGNOSIS — Z3689 Encounter for other specified antenatal screening: Secondary | ICD-10-CM

## 2017-06-27 DIAGNOSIS — Z3482 Encounter for supervision of other normal pregnancy, second trimester: Secondary | ICD-10-CM | POA: Diagnosis not present

## 2017-06-27 LAB — POCT URINALYSIS DIPSTICK
BILIRUBIN UA: NEGATIVE
GLUCOSE UA: NEGATIVE
KETONES UA: NEGATIVE
Leukocytes, UA: NEGATIVE
Nitrite, UA: NEGATIVE
PH UA: 6 (ref 5.0–8.0)
Protein, UA: NEGATIVE
RBC UA: NEGATIVE
SPEC GRAV UA: 1.025 (ref 1.010–1.025)
UROBILINOGEN UA: 0.2 U/dL

## 2017-06-27 NOTE — Progress Notes (Signed)
ROB doing well. Anatomy scan today, reviewed results. Anatomic survey is complete and appears WNL; Gender - Female. ( see below for full report) She states that she does not feel baby move much reviewed that this is due to anterior placenta. She verbalizes understanding. Follow up 4 wks.   Doreene BurkeAnnie Manaal Mandala, CNM   ULTRASOUND REPORT  Location: [facility] Date of Service:  06/27/2017  Indications: Anatomy Findings:  Singleton intrauterine pregnancy is visualized with FHR at 153 BPM. Biometrics give an (U/S) Gestational age of 820 5/7 weeks and an (U/S) EDD of 11/09/17; this correlates with the clinically established EDD of 11/10/17.  Fetal presentation is footling breech.  EFW: 383 grams (0lb 14oz). Placenta: Anterior and grade 1.  Placenta is remote to cervix at 3.3 cm from cervical os. AFI: WNL subjectively.  Anatomic survey is complete and appears WNL; Gender - Female.   Right Ovary measures 3.2 x 2.6 x 2.0 cm. It is normal in appearance. Left Ovary measures 2.4 x 1.8 x 1.8 cm. It is normal appearance. There is no obvious evidence of a corpus luteal cyst. Survey of the adnexa demonstrates no adnexal masses. There is no free peritoneal fluid in the cul de sac.  Impression: 1. 20 5/7 week Viable Singleton Intrauterine pregnancy by U/S. 2. (U/S) EDD is consistent with Clinically established (LMP) EDD of 11/10/17. 3. Normal Anatomy Scan 4. Placenta is remote to cervix at 3.3 cm from cervical os.  Recommendations: 1.Clinical correlation with the patient's History and Physical Exam.   Kari BaarsJill Long, RDMS

## 2017-06-27 NOTE — Patient Instructions (Signed)

## 2017-06-27 NOTE — Progress Notes (Signed)
Pt is here for an ROB visit. 

## 2017-07-13 NOTE — Telephone Encounter (Signed)
Error

## 2017-07-25 ENCOUNTER — Ambulatory Visit (INDEPENDENT_AMBULATORY_CARE_PROVIDER_SITE_OTHER): Payer: BC Managed Care – PPO | Admitting: Obstetrics and Gynecology

## 2017-07-25 VITALS — BP 110/70 | HR 94 | Wt 200.2 lb

## 2017-07-25 DIAGNOSIS — Z3492 Encounter for supervision of normal pregnancy, unspecified, second trimester: Secondary | ICD-10-CM

## 2017-07-25 LAB — POCT URINALYSIS DIPSTICK
Bilirubin, UA: NEGATIVE
GLUCOSE UA: NEGATIVE
Ketones, UA: NEGATIVE
NITRITE UA: NEGATIVE
RBC UA: NEGATIVE
SPEC GRAV UA: 1.01 (ref 1.010–1.025)
Urobilinogen, UA: 0.2 E.U./dL
pH, UA: 6.5 (ref 5.0–8.0)

## 2017-07-25 NOTE — Progress Notes (Signed)
ROB-doing well, denies any concerns. glucola next visit.

## 2017-07-25 NOTE — Progress Notes (Signed)
ROB- pt is doing well 

## 2017-08-29 ENCOUNTER — Ambulatory Visit (INDEPENDENT_AMBULATORY_CARE_PROVIDER_SITE_OTHER): Payer: BC Managed Care – PPO | Admitting: Obstetrics and Gynecology

## 2017-08-29 ENCOUNTER — Other Ambulatory Visit: Payer: BC Managed Care – PPO

## 2017-08-29 VITALS — BP 110/69 | HR 98 | Wt 204.5 lb

## 2017-08-29 DIAGNOSIS — Z3493 Encounter for supervision of normal pregnancy, unspecified, third trimester: Secondary | ICD-10-CM

## 2017-08-29 DIAGNOSIS — Z23 Encounter for immunization: Secondary | ICD-10-CM | POA: Diagnosis not present

## 2017-08-29 DIAGNOSIS — Z131 Encounter for screening for diabetes mellitus: Secondary | ICD-10-CM

## 2017-08-29 DIAGNOSIS — Z130 Encounter for screening for diseases of the blood and blood-forming organs and certain disorders involving the immune mechanism: Secondary | ICD-10-CM

## 2017-08-29 LAB — POCT URINALYSIS DIPSTICK
Bilirubin, UA: NEGATIVE
Blood, UA: NEGATIVE
GLUCOSE UA: NEGATIVE
Ketones, UA: NEGATIVE
LEUKOCYTES UA: NEGATIVE
Nitrite, UA: NEGATIVE
PROTEIN UA: NEGATIVE
SPEC GRAV UA: 1.01 (ref 1.010–1.025)
Urobilinogen, UA: 0.2 E.U./dL
pH, UA: 6 (ref 5.0–8.0)

## 2017-08-29 MED ORDER — TETANUS-DIPHTH-ACELL PERTUSSIS 5-2.5-18.5 LF-MCG/0.5 IM SUSP
0.5000 mL | Freq: Once | INTRAMUSCULAR | Status: AC
  Administered 2017-08-29: 0.5 mL via INTRAMUSCULAR

## 2017-08-29 NOTE — Progress Notes (Signed)
ROB- glucola done, blood consent signed, tdap given, pt is doing well 

## 2017-08-29 NOTE — Progress Notes (Signed)
ROB- doing well except slight swelling in legs in evening. Glucose test today. Tdap given. Discussed birth control.

## 2017-08-29 NOTE — Patient Instructions (Signed)

## 2017-08-30 LAB — CBC
HEMATOCRIT: 34.6 % (ref 34.0–46.6)
Hemoglobin: 11.4 g/dL (ref 11.1–15.9)
MCH: 30.2 pg (ref 26.6–33.0)
MCHC: 32.9 g/dL (ref 31.5–35.7)
MCV: 92 fL (ref 79–97)
PLATELETS: 236 10*3/uL (ref 150–379)
RBC: 3.78 x10E6/uL (ref 3.77–5.28)
RDW: 13.1 % (ref 12.3–15.4)
WBC: 7.1 10*3/uL (ref 3.4–10.8)

## 2017-08-30 LAB — GLUCOSE, 1 HOUR GESTATIONAL: Gestational Diabetes Screen: 146 mg/dL — ABNORMAL HIGH (ref 65–139)

## 2017-08-30 LAB — RPR: RPR Ser Ql: NONREACTIVE

## 2017-08-31 ENCOUNTER — Other Ambulatory Visit: Payer: BC Managed Care – PPO

## 2017-09-01 ENCOUNTER — Other Ambulatory Visit: Payer: BC Managed Care – PPO

## 2017-09-01 DIAGNOSIS — Z8632 Personal history of gestational diabetes: Secondary | ICD-10-CM

## 2017-09-02 LAB — GESTATIONAL GLUCOSE TOLERANCE
GLUCOSE 1 HOUR GTT: 160 mg/dL (ref 65–179)
GLUCOSE 2 HOUR GTT: 156 mg/dL — AB (ref 65–154)
Glucose, Fasting: 78 mg/dL (ref 65–94)
Glucose, GTT - 3 Hour: 151 mg/dL — ABNORMAL HIGH (ref 65–139)

## 2017-09-05 ENCOUNTER — Other Ambulatory Visit: Payer: Self-pay | Admitting: Obstetrics and Gynecology

## 2017-09-05 DIAGNOSIS — O24419 Gestational diabetes mellitus in pregnancy, unspecified control: Secondary | ICD-10-CM

## 2017-09-14 ENCOUNTER — Encounter: Payer: Self-pay | Admitting: Certified Nurse Midwife

## 2017-09-14 ENCOUNTER — Other Ambulatory Visit: Payer: Self-pay

## 2017-09-14 ENCOUNTER — Ambulatory Visit (INDEPENDENT_AMBULATORY_CARE_PROVIDER_SITE_OTHER): Payer: BC Managed Care – PPO | Admitting: Certified Nurse Midwife

## 2017-09-14 VITALS — BP 112/68 | HR 97 | Wt 202.5 lb

## 2017-09-14 DIAGNOSIS — Z3493 Encounter for supervision of normal pregnancy, unspecified, third trimester: Secondary | ICD-10-CM

## 2017-09-14 DIAGNOSIS — O24419 Gestational diabetes mellitus in pregnancy, unspecified control: Secondary | ICD-10-CM

## 2017-09-14 LAB — POCT URINALYSIS DIPSTICK
Bilirubin, UA: NEGATIVE
Blood, UA: NEGATIVE
GLUCOSE UA: NEGATIVE
Ketones, UA: NEGATIVE
LEUKOCYTES UA: NEGATIVE
Nitrite, UA: NEGATIVE
Protein, UA: NEGATIVE
Spec Grav, UA: 1.01 (ref 1.010–1.025)
Urobilinogen, UA: 0.2 E.U./dL
pH, UA: 7.5 (ref 5.0–8.0)

## 2017-09-14 MED ORDER — GLUCOSE BLOOD VI STRP: ORAL_STRIP | 12 refills | Status: DC

## 2017-09-14 NOTE — Progress Notes (Signed)
Pt is here for an ROB visit. 

## 2017-09-14 NOTE — Patient Instructions (Signed)
Common Medications Safe in Pregnancy  Acne:      Constipation:  Benzoyl Peroxide     Colace  Clindamycin      Dulcolax Suppository  Topica Erythromycin     Fibercon  Salicylic Acid      Metamucil         Miralax AVOID:        Senakot   Accutane    Cough:  Retin-A       Cough Drops  Tetracycline      Phenergan w/ Codeine if Rx  Minocycline      Robitussin (Plain & DM)  Antibiotics:     Crabs/Lice:  Ceclor       RID  Cephalosporins    AVOID:  E-Mycins      Kwell  Keflex  Macrobid/Macrodantin   Diarrhea:  Penicillin      Kao-Pectate  Zithromax      Imodium AD         PUSH FLUIDS AVOID:       Cipro     Fever:  Tetracycline      Tylenol (Regular or Extra  Minocycline       Strength)  Levaquin      Extra Strength-Do not          Exceed 8 tabs/24 hrs Caffeine:        <200mg/day (equiv. To 1 cup of coffee or  approx. 3 12 oz sodas)         Gas: Cold/Hayfever:       Gas-X  Benadryl      Mylicon  Claritin       Phazyme  **Claritin-D        Chlor-Trimeton    Headaches:  Dimetapp      ASA-Free Excedrin  Drixoral-Non-Drowsy     Cold Compress  Mucinex (Guaifenasin)     Tylenol (Regular or Extra  Sudafed/Sudafed-12 Hour     Strength)  **Sudafed PE Pseudoephedrine   Tylenol Cold & Sinus     Vicks Vapor Rub  Zyrtec  **AVOID if Problems With Blood Pressure         Heartburn: Avoid lying down for at least 1 hour after meals  Aciphex      Maalox     Rash:  Milk of Magnesia     Benadryl    Mylanta       1% Hydrocortisone Cream  Pepcid  Pepcid Complete   Sleep Aids:  Prevacid      Ambien   Prilosec       Benadryl  Rolaids       Chamomile Tea  Tums (Limit 4/day)     Unisom  Zantac       Tylenol PM         Warm milk-add vanilla or  Hemorrhoids:       Sugar for taste  Anusol/Anusol H.C.  (RX: Analapram 2.5%)  Sugar Substitutes:  Hydrocortisone OTC     Ok in moderation  Preparation H      Tucks        Vaseline lotion applied to tissue with  wiping    Herpes:     Throat:  Acyclovir      Oragel  Famvir  Valtrex     Vaccines:         Flu Shot Leg Cramps:       *Gardasil  Benadryl      Hepatitis A         Hepatitis B Nasal Spray:         Pneumovax  Saline Nasal Spray     Polio Booster         Tetanus Nausea:       Tuberculosis test or PPD  Vitamin B6 25 mg TID   AVOID:    Dramamine      *Gardasil  Emetrol       Live Poliovirus  Ginger Root 250 mg QID    MMR (measles, mumps &  High Complex Carbs @ Bedtime    rebella)  Sea Bands-Accupressure    Varicella (Chickenpox)  Unisom 1/2 tab TID     *No known complications           If received before Pain:         Known pregnancy;   Darvocet       Resume series after  Lortab        Delivery  Percocet    Yeast:   Tramadol      Femstat  Tylenol 3      Gyne-lotrimin  Ultram       Monistat  Vicodin           MISC:         All Sunscreens           Hair Coloring/highlights          Insect Repellant's          (Including DEET)         Mystic Tans Gestational Diabetes Mellitus, Diagnosis Gestational diabetes (gestational diabetes mellitus) is a short-term (temporary) form of diabetes that can happen during pregnancy. It goes away after you give birth. It may be caused by one or both of these problems:  Your body does not make enough of a hormone called insulin.  Your body does not respond in a normal way to insulin that it makes.  Insulin lets sugars (glucose) go into cells in the body. This gives you energy. If you have diabetes, sugars cannot get into cells. This causes high blood sugar (hyperglycemia). If diabetes is treated, it may not hurt you or your baby. Your doctor will set treatment goals for you. In general, you should have these blood sugar levels:  After not eating for a long time (fasting): 95 mg/dL (5.3 mmol/L).  After meals (postprandial): ? One hour after a meal: at or below 140 mg/dL (7.8 mmol/L). ? Two hours after a meal: at or below 120 mg/dL (6.7  mmol/L).  A1c (hemoglobin A1c) level: 6-6.5%.  Follow these instructions at home: Questions to Ask Your Doctor  You may want to ask these questions:  Do I need to meet with a diabetes educator?  Where can I find a support group for people with diabetes?  What equipment will I need to care for myself at home?  What diabetes medicines do I need? When should I take them?  How often do I need to check my blood sugar?  What number can I call if I have questions?  When is my next doctor's visit?  General instructions  Take over-the-counter and prescription medicines only as told by your doctor.  Stay at a healthy weight during pregnancy.  Keep all follow-up visits as told by your doctor. This is important. Contact a doctor if:  Your blood sugar is at or above 240 mg/dL (13.3 mmol/L).  Your blood sugar is at or above 200 mg/dL (11.1 mmol/L) and you have ketones in your pee (urine).  You have been sick or have had a fever for  2 days or more and you are not getting better.  You have any of these problems for more than 6 hours: ? You cannot eat or drink. ? You feel sick to your stomach (nauseous). ? You throw up (vomit). ? You have watery poop (diarrhea). Get help right away if:  Your blood sugar is lower than 54 mg/dL (3 mmol/L).  You get confused.  You have trouble: ? Thinking clearly. ? Breathing.  Your baby moves less than normal.  You have: ? Moderate or large ketone levels in your pee (urine). ? Bleeding from your vagina. ? Unusual fluid coming from your vagina. ? Early contractions. These may feel like tightness in your belly. This information is not intended to replace advice given to you by your health care provider. Make sure you discuss any questions you have with your health care provider. Document Released: 08/03/2015 Document Revised: 09/17/2015 Document Reviewed: 05/15/2015 Elsevier Interactive Patient Education  [employer].

## 2017-09-15 NOTE — Progress Notes (Signed)
ROB-Doing well, checking blood sugars three (3) times a day before meals. All fasting sugars less than 96; single after meal level 122. Advised morning fasting blood sugar and checking two (2) hours after every meal; patient verbalized understanding. Reports "eating better" and "decreasing soda intake". Offered referral to [facility], patient declines at this time. Reviewed red flag symptoms and when to call. Send blood sugar log next week via MyChart. RTC x 2 week for ROB or sooner if needed.

## 2017-09-20 ENCOUNTER — Telehealth: Payer: Self-pay | Admitting: Certified Nurse Midwife

## 2017-09-20 ENCOUNTER — Telehealth: Payer: Self-pay

## 2017-09-20 ENCOUNTER — Other Ambulatory Visit: Payer: Self-pay

## 2017-09-20 ENCOUNTER — Telehealth: Payer: Self-pay | Admitting: Obstetrics and Gynecology

## 2017-09-20 MED ORDER — GLUCOSE BLOOD VI STRP: ORAL_STRIP | 12 refills | Status: DC

## 2017-09-20 NOTE — Telephone Encounter (Signed)
Test strips escribed to [facility] with instructions to pharmacist - Pt uses One Touch Verio Test strips. Confirmation of received.

## 2017-09-20 NOTE — Telephone Encounter (Signed)
Patient called stating the wrong test strips were sent in to her pharmacy. She needs one touch verio test strips. She would like for them to be sent to the [facility] [employer] [facility] pharmacy.Thanks

## 2017-09-20 NOTE — Telephone Encounter (Signed)
UPDATED SCRIPT RE SENT

## 2017-09-20 NOTE — Telephone Encounter (Signed)
The patients pharmacy called and stated that they are not able to file the patients prescription for testing strips correctly because they need the testing frequency for the Blood GT test strips. Barbara Cower is the pharmacist that called. Please advise.

## 2017-09-25 ENCOUNTER — Encounter: Payer: Self-pay | Admitting: Obstetrics and Gynecology

## 2017-09-29 ENCOUNTER — Encounter: Payer: Self-pay | Admitting: Certified Nurse Midwife

## 2017-09-29 ENCOUNTER — Ambulatory Visit (INDEPENDENT_AMBULATORY_CARE_PROVIDER_SITE_OTHER): Payer: BC Managed Care – PPO | Admitting: Certified Nurse Midwife

## 2017-09-29 VITALS — BP 111/72 | HR 103 | Wt 198.6 lb

## 2017-09-29 DIAGNOSIS — Z3493 Encounter for supervision of normal pregnancy, unspecified, third trimester: Secondary | ICD-10-CM | POA: Diagnosis not present

## 2017-09-29 LAB — POCT URINALYSIS DIPSTICK
Bilirubin, UA: NEGATIVE
Blood, UA: NEGATIVE
Glucose, UA: NEGATIVE
KETONES UA: POSITIVE
Leukocytes, UA: NEGATIVE
NITRITE UA: NEGATIVE
PH UA: 7 (ref 5.0–8.0)
PROTEIN UA: NEGATIVE
SPEC GRAV UA: 1.02 (ref 1.010–1.025)
UROBILINOGEN UA: 0.2 U/dL

## 2017-09-29 NOTE — Patient Instructions (Signed)

## 2017-09-29 NOTE — Progress Notes (Signed)
ROB doing well. No complaints. BS reviewed. Pt states that she has had a couple of elevated fastings sugars in the 100 range but states she had a late snack. @ hr PP have all been normal. Feels good movement. Anticipatory guidance for 36 wk visit. Growth u/s and NST at next visit. Follow up 2 wk.    Doreene BurkeAnnie Connar Keating, CNM

## 2017-09-29 NOTE — Progress Notes (Signed)
Pt is here for an ROB visit. 

## 2017-10-13 ENCOUNTER — Ambulatory Visit: Payer: BC Managed Care – PPO

## 2017-10-13 ENCOUNTER — Ambulatory Visit (INDEPENDENT_AMBULATORY_CARE_PROVIDER_SITE_OTHER): Payer: BC Managed Care – PPO | Admitting: Certified Nurse Midwife

## 2017-10-13 ENCOUNTER — Encounter: Payer: Self-pay | Admitting: Certified Nurse Midwife

## 2017-10-13 ENCOUNTER — Ambulatory Visit (INDEPENDENT_AMBULATORY_CARE_PROVIDER_SITE_OTHER): Payer: BC Managed Care – PPO

## 2017-10-13 VITALS — BP 90/66 | HR 93 | Wt 199.2 lb

## 2017-10-13 DIAGNOSIS — Z3A37 37 weeks gestation of pregnancy: Secondary | ICD-10-CM | POA: Diagnosis not present

## 2017-10-13 DIAGNOSIS — O24410 Gestational diabetes mellitus in pregnancy, diet controlled: Secondary | ICD-10-CM

## 2017-10-13 DIAGNOSIS — Z3493 Encounter for supervision of normal pregnancy, unspecified, third trimester: Secondary | ICD-10-CM

## 2017-10-13 LAB — POCT URINALYSIS DIPSTICK
BILIRUBIN UA: NEGATIVE
Blood, UA: NEGATIVE
Glucose, UA: NEGATIVE
Ketones, UA: NEGATIVE
Leukocytes, UA: NEGATIVE
NITRITE UA: NEGATIVE
PH UA: 7 (ref 5.0–8.0)
PROTEIN UA: POSITIVE — AB
Spec Grav, UA: 1.02 (ref 1.010–1.025)
UROBILINOGEN UA: 0.2 U/dL

## 2017-10-13 NOTE — Progress Notes (Signed)
ROB, NST and u/s for growth ( see below) for GDM.. BS fasting 95 or below, 2 hr PP below 125. She feels good movement. GBS and cultures today. PTL reviewed. Follow up 1 wk.   Erica BurkeAnnie Joanathan Gibson, CNM   NST reactive: basline 135, moderate variability, accelerations present, decelerations absent. No contractions. Category 1 ULTRASOUND REPORT  Location: [facility] Date of Service:  10/13/2017  Indications: Growth; GDM Findings:  Singleton intrauterine pregnancy is visualized with FHR at 133 BPM. Biometrics give an (U/S) Gestational age of 27 4/7 weeks and an (U/S) EDD of 10/30/17; this correlates with the clinically established EDD of 11/10/17.  Fetal presentation is vertex.  EFW: 3233 grams (7lb 2oz).  71st percentile.  BPD measures 99th, HC measures 52nd, AC measures 95th, and FL measures 55th percentile. Placenta: Anterior and grade 2-3. AFI: 12.4 cm.   Impression: 1. 37 4/7 week Viable Singleton Intrauterine pregnancy by U/S. 2. (U/S) EDD is consistent with Clinically established (LMP) EDD of 11/10/17. 3. EFW: 3233 grams (7lb 2oz). 71st percentile. - Individual measurements stated above.  Recommendations: 1.Clinical correlation with the patient's History and Physical Exam.   Erica BaarsJill Gibson, RDMS

## 2017-10-13 NOTE — Patient Instructions (Signed)
Braxton Hicks Contractions °Contractions of the uterus can occur throughout pregnancy, but they are not always a sign that you are in labor. You may have practice contractions called Braxton Hicks contractions. These false labor contractions are sometimes confused with true labor. °What are Braxton Hicks contractions? °Braxton Hicks contractions are tightening movements that occur in the muscles of the uterus before labor. Unlike true labor contractions, these contractions do not result in opening (dilation) and thinning of the cervix. Toward the end of pregnancy (32-34 weeks), Braxton Hicks contractions can happen more often and may become stronger. These contractions are sometimes difficult to tell apart from true labor because they can be very uncomfortable. You should not feel embarrassed if you go to the hospital with false labor. °Sometimes, the only way to tell if you are in true labor is for your health care provider to look for changes in the cervix. The health care provider will do a physical exam and may monitor your contractions. If you are not in true labor, the exam should show that your cervix is not dilating and your water has not broken. °If there are other health problems associated with your pregnancy, it is completely safe for you to be sent home with false labor. You may continue to have Braxton Hicks contractions until you go into true labor. °How to tell the difference between true labor and false labor °True labor °· Contractions last 30-70 seconds. °· Contractions become very regular. °· Discomfort is usually felt in the top of the uterus, and it spreads to the lower abdomen and low back. °· Contractions do not go away with walking. °· Contractions usually become more intense and increase in frequency. °· The cervix dilates and gets thinner. °False labor °· Contractions are usually shorter and not as strong as true labor contractions. °· Contractions are usually irregular. °· Contractions  are often felt in the front of the lower abdomen and in the groin. °· Contractions may go away when you walk around or change positions while lying down. °· Contractions get weaker and are shorter-lasting as time goes on. °· The cervix usually does not dilate or become thin. °Follow these instructions at home: °· Take over-the-counter and prescription medicines only as told by your health care provider. °· Keep up with your usual exercises and follow other instructions from your health care provider. °· Eat and drink lightly if you think you are going into labor. °· If Braxton Hicks contractions are making you uncomfortable: °? Change your position from lying down or resting to walking, or change from walking to resting. °? Sit and rest in a tub of warm water. °? Drink enough fluid to keep your urine pale yellow. Dehydration may cause these contractions. °? Do slow and deep breathing several times an hour. °· Keep all follow-up prenatal visits as told by your health care provider. This is important. °Contact a health care provider if: °· You have a fever. °· You have continuous pain in your abdomen. °Get help right away if: °· Your contractions become stronger, more regular, and closer together. °· You have fluid leaking or gushing from your vagina. °· You pass blood-tinged mucus (bloody show). °· You have bleeding from your vagina. °· You have low back pain that you never had before. °· You feel your baby’s head pushing down and causing pelvic pressure. °· Your baby is not moving inside you as much as it used to. °Summary °· Contractions that occur before labor are called Braxton   Hicks contractions, false labor, or practice contractions. °· Braxton Hicks contractions are usually shorter, weaker, farther apart, and less regular than true labor contractions. True labor contractions usually become progressively stronger and regular and they become more frequent. °· Manage discomfort from Braxton Hicks contractions by  changing position, resting in a warm bath, drinking plenty of water, or practicing deep breathing. °This information is not intended to replace advice given to you by your health care provider. Make sure you discuss any questions you have with your health care provider. °Document Released: 08/25/2016 Document Revised: 08/25/2016 Document Reviewed: 08/25/2016 °[employer] Interactive Patient Education © 2018 [employer] ° °

## 2017-10-13 NOTE — Progress Notes (Signed)
Pt is here for an ROB visit, NST and U/S.

## 2017-10-15 ENCOUNTER — Encounter (INDEPENDENT_AMBULATORY_CARE_PROVIDER_SITE_OTHER): Payer: Self-pay

## 2017-10-15 LAB — STREP GP B NAA: Strep Gp B NAA: NEGATIVE

## 2017-10-16 LAB — GC/CHLAMYDIA PROBE AMP
CHLAMYDIA, DNA PROBE: NEGATIVE
NEISSERIA GONORRHOEAE BY PCR: NEGATIVE

## 2017-10-20 ENCOUNTER — Other Ambulatory Visit: Payer: BC Managed Care – PPO

## 2017-10-20 ENCOUNTER — Ambulatory Visit (INDEPENDENT_AMBULATORY_CARE_PROVIDER_SITE_OTHER): Payer: BC Managed Care – PPO | Admitting: Certified Nurse Midwife

## 2017-10-20 VITALS — BP 115/71 | HR 85 | Wt 200.0 lb

## 2017-10-20 DIAGNOSIS — Z3493 Encounter for supervision of normal pregnancy, unspecified, third trimester: Secondary | ICD-10-CM | POA: Diagnosis not present

## 2017-10-20 LAB — POCT URINALYSIS DIPSTICK
Bilirubin, UA: NEGATIVE
Blood, UA: NEGATIVE
GLUCOSE UA: NEGATIVE
Leukocytes, UA: NEGATIVE
NITRITE UA: NEGATIVE
PROTEIN UA: NEGATIVE
SPEC GRAV UA: 1.02 (ref 1.010–1.025)
Urobilinogen, UA: 0.2 E.U./dL
pH, UA: 6.5 (ref 5.0–8.0)

## 2017-10-20 NOTE — Patient Instructions (Addendum)
Vaginal Delivery Vaginal delivery means that you will give birth by pushing your baby out of your birth canal (vagina). A team of health care providers will help you before, during, and after vaginal delivery. Birth experiences are unique for every woman and every pregnancy, and birth experiences vary depending on where you choose to give birth. What should I do to prepare for my baby's birth? Before your baby is born, it is important to talk with your health care provider about:  Your labor and delivery preferences. These may include: ? Medicines that you may be given. ? How you will manage your pain. This might include non-medical pain relief techniques or injectable pain relief such as epidural analgesia. ? How you and your baby will be monitored during labor and delivery. ? Who may be in the labor and delivery room with you. ? Your feelings about surgical delivery of your baby (cesarean delivery, or C-section) if this becomes necessary. ? Your feelings about receiving donated blood through an IV tube (blood transfusion) if this becomes necessary.  Whether you are able: ? To take pictures or videos of the birth. ? To eat during labor and delivery. ? To move around, walk, or change positions during labor and delivery.  What to expect after your baby is born, such as: ? Whether delayed umbilical cord clamping and cutting is offered. ? Who will care for your baby right after birth. ? Medicines or tests that may be recommended for your baby. ? Whether breastfeeding is supported in your hospital or birth center. ? How long you will be in the hospital or birth center.  How any medical conditions you have may affect your baby or your labor and delivery experience.  To prepare for your baby's birth, you should also:  Attend all of your health care visits before delivery (prenatal visits) as recommended by your health care provider. This is important.  Prepare your home for your baby's  arrival. Make sure that you have: ? Diapers. ? Baby clothing. ? Feeding equipment. ? Safe sleeping arrangements for you and your baby.  Install a car seat in your vehicle. Have your car seat checked by a certified car seat installer to make sure that it is installed safely.  Think about who will help you with your new baby at home for at least the first several weeks after delivery.  What can I expect when I arrive at the birth center or hospital? Once you are in labor and have been admitted into the hospital or birth center, your health care provider may:  Review your pregnancy history and any concerns you have.  Insert an IV tube into one of your veins. This is used to give you fluids and medicines.  Check your blood pressure, pulse, temperature, and heart rate (vital signs).  Check whether your bag of water (amniotic sac) has broken (ruptured).  Talk with you about your birth plan and discuss pain control options.  Monitoring Your health care provider may monitor your contractions (uterine monitoring) and your baby's heart rate (fetal monitoring). You may need to be monitored:  Often, but not continuously (intermittently).  All the time or for long periods at a time (continuously). Continuous monitoring may be needed if: ? You are taking certain medicines, such as medicine to relieve pain or make your contractions stronger. ? You have pregnancy or labor complications.  Monitoring may be done by:  Placing a special stethoscope or a handheld monitoring device on your abdomen to   check your baby's heartbeat, and feeling your abdomen for contractions. This method of monitoring does not continuously record your baby's heartbeat or your contractions.  Placing monitors on your abdomen (external monitors) to record your baby's heartbeat and the frequency and length of contractions. You may not have to wear external monitors all the time.  Placing monitors inside of your uterus  (internal monitors) to record your baby's heartbeat and the frequency, length, and strength of your contractions. ? Your health care provider may use internal monitors if he or she needs more information about the strength of your contractions or your baby's heart rate. ? Internal monitors are put in place by passing a thin, flexible wire through your vagina and into your uterus. Depending on the type of monitor, it may remain in your uterus or on your baby's head until birth. ? Your health care provider will discuss the benefits and risks of internal monitoring with you and will ask for your permission before inserting the monitors.  Telemetry. This is a type of continuous monitoring that can be done with external or internal monitors. Instead of having to stay in bed, you are able to move around during telemetry. Ask your health care provider if telemetry is an option for you.  Physical exam Your health care provider may perform a physical exam. This may include:  Checking whether your baby is positioned: ? With the head toward your vagina (head-down). This is most common. ? With the head toward the top of your uterus (head-up or breech). If your baby is in a breech position, your health care provider may try to turn your baby to a head-down position so you can deliver vaginally. If it does not seem that your baby can be born vaginally, your provider may recommend surgery to deliver your baby. In rare cases, you may be able to deliver vaginally if your baby is head-up (breech delivery). ? Lying sideways (transverse). Babies that are lying sideways cannot be delivered vaginally.  Checking your cervix to determine: ? Whether it is thinning out (effacing). ? Whether it is opening up (dilating). ? How low your baby has moved into your birth canal.  What are the three stages of labor and delivery?  Normal labor and delivery is divided into the following three stages: Stage 1  Stage 1 is the  longest stage of labor, and it can last for hours or days. Stage 1 includes: ? Early labor. This is when contractions may be irregular, or regular and mild. Generally, early labor contractions are more than 10 minutes apart. ? Active labor. This is when contractions get longer, more regular, more frequent, and more intense. ? The transition phase. This is when contractions happen very close together, are very intense, and may last longer than during any other part of labor.  Contractions generally feel mild, infrequent, and irregular at first. They get stronger, more frequent (about every 2-3 minutes), and more regular as you progress from early labor through active labor and transition.  Many women progress through stage 1 naturally, but you may need help to continue making progress. If this happens, your health care provider may talk with you about: ? Rupturing your amniotic sac if it has not ruptured yet. ? Giving you medicine to help make your contractions stronger and more frequent.  Stage 1 ends when your cervix is completely dilated to 4 inches (10 cm) and completely effaced. This happens at the end of the transition phase. Stage 2  Once   your cervix is completely effaced and dilated to 4 inches (10 cm), you may start to feel an urge to push. It is common for the body to naturally take a rest before feeling the urge to push, especially if you received an epidural or certain other pain medicines. This rest period may last for up to 1-2 hours, depending on your unique labor experience.  During stage 2, contractions are generally less painful, because pushing helps relieve contraction pain. Instead of contraction pain, you may feel stretching and burning pain, especially when the widest part of your baby's head passes through the vaginal opening (crowning).  Your health care provider will closely monitor your pushing progress and your baby's progress through the vagina during stage 2.  Your  health care provider may massage the area of skin between your vaginal opening and anus (perineum) or apply warm compresses to your perineum. This helps it stretch as the baby's head starts to crown, which can help prevent perineal tearing. ? In some cases, an incision may be made in your perineum (episiotomy) to allow the baby to pass through the vaginal opening. An episiotomy helps to make the opening of the vagina larger to allow more room for the baby to fit through.  It is very important to breathe and focus so your health care provider can control the delivery of your baby's head. Your health care provider may have you decrease the intensity of your pushing, to help prevent perineal tearing.  After delivery of your baby's head, the shoulders and the rest of the body generally deliver very quickly and without difficulty.  Once your baby is delivered, the umbilical cord may be cut right away, or this may be delayed for 1-2 minutes, depending on your baby's health. This may vary among health care providers, hospitals, and birth centers.  If you and your baby are healthy enough, your baby may be placed on your chest or abdomen to help maintain the baby's temperature and to help you bond with each other. Some mothers and babies start breastfeeding at this time. Your health care team will dry your baby and help keep your baby warm during this time.  Your baby may need immediate care if he or she: ? Showed signs of distress during labor. ? Has a medical condition. ? Was born too early (prematurely). ? Had a bowel movement before birth (meconium). ? Shows signs of difficulty transitioning from being inside the uterus to being outside of the uterus. If you are planning to breastfeed, your health care team will help you begin a feeding. Stage 3  The third stage of labor starts immediately after the birth of your baby and ends after you deliver the placenta. The placenta is an organ that develops  during pregnancy to provide oxygen and nutrients to your baby in the womb.  Delivering the placenta may require some pushing, and you may have mild contractions. Breastfeeding can stimulate contractions to help you deliver the placenta.  After the placenta is delivered, your uterus should tighten (contract) and become firm. This helps to stop bleeding in your uterus. To help your uterus contract and to control bleeding, your health care provider may: ? Give you medicine by injection, through an IV tube, by mouth, or through your rectum (rectally). ? Massage your abdomen or perform a vaginal exam to remove any blood clots that are left in your uterus. ? Empty your bladder by placing a thin, flexible tube (catheter) into your bladder. ? Encourage   you to breastfeed your baby. After labor is over, you and your baby will be monitored closely to ensure that you are both healthy until you are ready to go home. Your health care team will teach you how to care for yourself and your baby. This information is not intended to replace advice given to you by your health care provider. Make sure you discuss any questions you have with your health care provider. Document Released: 01/19/2008 Document Revised: 10/30/2015 Document Reviewed: 04/26/2015 Elsevier Interactive Patient Education  2018 [employer]. Nonstress Test The nonstress test is a procedure that monitors the fetus's heartbeat. The test will monitor the heartbeat when the fetus is at rest and while the fetus is moving. In a healthy fetus, there will be an increase in fetal heart rate when the fetus moves or kicks. The heart rate will decrease at rest. This test helps determine if the fetus is healthy. Your health care provider will look at a number of patterns in the heart rate tracing to make sure your baby is thriving. If there is concern, your health care provider may order additional tests or may suggest another course of action. This test is often  done in the third trimester and can help determine if an early delivery is needed and safe. Common reasons to have this test are:  You are past your due date.  You have a high-risk pregnancy.  You are feeling less movement than normal.  You have lost a pregnancy in the past.  Your health care provider suspects fetal growth problems.  You have too much or too little amniotic fluid.  What happens before the procedure?  Eat a meal right before the test or as directed by your health care provider. Food may help stimulate fetal movements.  Use the restroom right before the test. What happens during the procedure?  Two belts will be placed around your abdomen. These belts have monitors attached to them. One records the fetal heart rate and the other records uterine contractions.  You may be asked to lie down on your side or to stay sitting upright.  You may be given a button to press when you feel movement.  The fetal heartbeat is listened to and watched on a screen. The heartbeat is recorded on a sheet of paper.  If the fetus seems to be sleeping, you may be asked to drink some juice or soda, gently press your abdomen, or make some noise to wake the fetus. What happens after the procedure? Your health care provider will discuss the test results with you and make recommendations for the near future.  This information is not intended to replace advice given to you by your health care provider. Make sure you discuss any questions you have with your health care provider. This information is not intended to replace advice given to you by your health care provider. Make sure you discuss any questions you have with your health care provider. Document Released: 04/01/2002 Document Revised: 03/11/2016 Document Reviewed: 05/15/2012 Elsevier Interactive Patient Education  2018 [employer]. Fetal Movement Counts Patient Name: ________________________________________________ Patient Due Date:  ____________________ What is a fetal movement count? A fetal movement count is the number of times that you feel your baby move during a certain amount of time. This may also be called a fetal kick count. A fetal movement count is recommended for every pregnant woman. You may be asked to start counting fetal movements as early as week 28 of your pregnancy.  Pay attention to when your baby is most active. You may notice your baby's sleep and wake cycles. You may also notice things that make your baby move more. You should do a fetal movement count:  When your baby is normally most active.  At the same time each day.  A good time to count movements is while you are resting, after having something to eat and drink. How do I count fetal movements? 1. Find a quiet, comfortable area. Sit, or lie down on your side. 2. Write down the date, the start time and stop time, and the number of movements that you felt between those two times. Take this information with you to your health care visits. 3. For 2 hours, count kicks, flutters, swishes, rolls, and jabs. You should feel at least 10 movements during 2 hours. 4. You may stop counting after you have felt 10 movements. 5. If you do not feel 10 movements in 2 hours, have something to eat and drink. Then, keep resting and counting for 1 hour. If you feel at least 4 movements during that hour, you may stop counting. Contact a health care provider if:  You feel fewer than 4 movements in 2 hours.  Your baby is not moving like he or she usually does. Date: ____________ Start time: ____________ Stop time: ____________ Movements: ____________ Date: ____________ Start time: ____________ Stop time: ____________ Movements: ____________ Date: ____________ Start time: ____________ Stop time: ____________ Movements: ____________ Date: ____________ Start time: ____________ Stop time: ____________ Movements: ____________ Date: ____________ Start time: ____________  Stop time: ____________ Movements: ____________ Date: ____________ Start time: ____________ Stop time: ____________ Movements: ____________ Date: ____________ Start time: ____________ Stop time: ____________ Movements: ____________ Date: ____________ Start time: ____________ Stop time: ____________ Movements: ____________ Date: ____________ Start time: ____________ Stop time: ____________ Movements: ____________ This information is not intended to replace advice given to you by your health care provider. Make sure you discuss any questions you have with your health care provider. Document Released: 05/11/2006 Document Revised: 12/09/2015 Document Reviewed: 05/21/2015 Elsevier Interactive Patient Education  2018 [employer]   Heartburn During Pregnancy Heartburn is pain or discomfort in the throat or chest. It may cause a burning feeling. It happens when stomach acid moves up into the tube that carries food from your mouth to your stomach (esophagus). Heartburn is common during pregnancy. It usually goes away or gets better after giving birth. Follow these instructions at home: Eating and drinking  Do not drink alcohol while you are pregnant.  Figure out which foods and beverages make you feel worse, and avoid them.  Beverages that you may want to avoid include: ? Coffee and tea (with or without caffeine). ? Energy drinks and sports drinks. ? Bubbly (carbonated) drinks or sodas. ? Citrus fruit juices.  Foods that you may want to avoid include: ? Chocolate and cocoa. ? Peppermint and mint flavorings. ? Garlic, onions, and horseradish. ? Spicy and acidic foods. These include peppers, chili powder, curry powder, vinegar, hot sauces, and barbecue sauce. ? Citrus fruits, such as oranges, lemons, and limes. ? Tomato-based foods, such as red sauce, chili, and salsa. ? Fried and fatty foods, such as donuts, french fries, potato chips, and high-fat dressings. ? High-fat meats, such as hot  dogs, cold cuts, sausage, ham, and bacon. ? High-fat dairy items, such as whole milk, butter, and cheese.  Eat small meals often, instead of large meals.  Avoid drinking a lot of liquid with your meals.  Avoid eating  meals during the 2-3 hours before you go to bed.  Avoid lying down right after you eat.  Do not exercise right after you eat. Medicines  Take over-the-counter and prescription medicines only as told by your doctor.  Do not take aspirin, ibuprofen, or other NSAIDs unless your doctor tells you to do that.  Your doctor may tell you to avoid medicines that have sodium bicarbonate in them. General instructions  If told, raise the head of your bed about 6 inches (15 cm). You can do this by putting blocks under the legs. Sleeping with more pillows does not help with heartburn.  Do not use any products that contain nicotine or tobacco, such as cigarettes and e-cigarettes. If you need help quitting, ask your doctor.  Wear loose-fitting clothing.  Try to lower your stress, such as with yoga or meditation. If you need help, ask your doctor.  Stay at a healthy weight. If you are overweight, work with your doctor to safely lose weight.  Keep all follow-up visits as told by your doctor. This is important. Contact a doctor if:  You get new symptoms.  Your symptoms do not get better with treatment.  You have weight loss and you do not know why.  You have trouble swallowing.  You make loud sounds when you breathe (wheeze).  You have a cough that does not go away.  You have heartburn often for more than 2 weeks.  You feel sick to your stomach (nauseous), and this does not get better with treatment.  You are throwing up (vomiting), and this does not get better with treatment.  You have pain in your belly (abdomen). Get help right away if:  You have very bad chest pain that spreads to your arm, neck, or jaw.  You feel sweaty, dizzy, or light-headed.  You have  trouble breathing.  You have pain when swallowing.  You throw up and your throw-up looks like blood or coffee grounds.  Your poop (stool) is bloody or black. This information is not intended to replace advice given to you by your health care provider. Make sure you discuss any questions you have with your health care provider. Document Released: 05/14/2010 Document Revised: 12/28/2015 Document Reviewed: 12/28/2015 Elsevier Interactive Patient Education  2017 [employer].

## 2017-10-20 NOTE — Progress Notes (Signed)
ROB-NST performed today was reviewed and was found to be reactive. Baseline 130 bpm with moderate variability, acceleration noted and no decelerations present. Presents without blood sugar log today, but reports all values within normal limits. Requests SVE. Anticipatory guidance regarding course of prenatal care. Reviewed red flag symptoms and when to call.  Continue recommended antenatal testing and prenatal care. RTC x 1 week for NST and ROB.

## 2017-10-20 NOTE — Progress Notes (Signed)
Pt is here for an NST and an ROB visit. C/o indigestion but doesn't think she will take anything for it.

## 2017-10-27 ENCOUNTER — Ambulatory Visit (INDEPENDENT_AMBULATORY_CARE_PROVIDER_SITE_OTHER): Payer: BC Managed Care – PPO | Admitting: Obstetrics and Gynecology

## 2017-10-27 ENCOUNTER — Other Ambulatory Visit: Payer: BC Managed Care – PPO

## 2017-10-27 VITALS — BP 101/66 | HR 86 | Wt 201.0 lb

## 2017-10-27 DIAGNOSIS — Z3A39 39 weeks gestation of pregnancy: Secondary | ICD-10-CM

## 2017-10-27 DIAGNOSIS — O9989 Other specified diseases and conditions complicating pregnancy, childbirth and the puerperium: Secondary | ICD-10-CM | POA: Diagnosis not present

## 2017-10-27 DIAGNOSIS — R102 Pelvic and perineal pain: Secondary | ICD-10-CM

## 2017-10-27 DIAGNOSIS — Z3493 Encounter for supervision of normal pregnancy, unspecified, third trimester: Secondary | ICD-10-CM

## 2017-10-27 LAB — POCT URINALYSIS DIPSTICK
Bilirubin, UA: NEGATIVE
Glucose, UA: NEGATIVE
Ketones, UA: 5
NITRITE UA: NEGATIVE
PH UA: 7 (ref 5.0–8.0)
PROTEIN UA: NEGATIVE
SPEC GRAV UA: 1.015 (ref 1.010–1.025)
Urobilinogen, UA: 0.2 E.U./dL

## 2017-10-27 NOTE — Progress Notes (Signed)
ROB and NST- NST performed today was reviewed and was found to be reactive. Baseline 130 with Moderate variability; No decels noted.  Continue recommended antenatal testing and prenatal care. Will schedule induction for 7/14 or later but by Due date

## 2017-10-27 NOTE — Progress Notes (Signed)
ROB&NST- pt is having some pelvic pressure 

## 2017-11-03 ENCOUNTER — Ambulatory Visit (INDEPENDENT_AMBULATORY_CARE_PROVIDER_SITE_OTHER): Payer: BC Managed Care – PPO | Admitting: Certified Nurse Midwife

## 2017-11-03 ENCOUNTER — Other Ambulatory Visit: Payer: BC Managed Care – PPO

## 2017-11-03 VITALS — BP 115/68 | HR 96 | Wt 201.0 lb

## 2017-11-03 DIAGNOSIS — Z3493 Encounter for supervision of normal pregnancy, unspecified, third trimester: Secondary | ICD-10-CM | POA: Diagnosis not present

## 2017-11-03 LAB — POCT URINALYSIS DIPSTICK
Bilirubin, UA: NEGATIVE
Blood, UA: NEGATIVE
Glucose, UA: NEGATIVE
Ketones, UA: NEGATIVE
NITRITE UA: NEGATIVE
PH UA: 6 (ref 5.0–8.0)
PROTEIN UA: NEGATIVE
Spec Grav, UA: 1.015 (ref 1.010–1.025)
UROBILINOGEN UA: 0.2 U/dL

## 2017-11-03 NOTE — Progress Notes (Signed)
ROB&NST- pt is having some pelvic pressure 

## 2017-11-03 NOTE — Patient Instructions (Signed)

## 2017-11-03 NOTE — Progress Notes (Signed)
ROB-Doing well, reports irregular contractions. Request SVE. NST performed today was reviewed and was found to be reactive. Baseline 140 bpm with moderate variability, accelerations present and no decelerations noted.  Continue twice daily kick counts. [facility] Monday, [facility] at 0500. Reviewed red flag symptoms and when to call. RTC x 7 weeks for PPV.

## 2017-11-06 ENCOUNTER — Inpatient Hospital Stay
Admission: EM | Admit: 2017-11-06 | Discharge: 2017-11-07 | DRG: 807 | Disposition: A | Discharge status: Home / Self Care | Payer: BC Managed Care – PPO | Source: Ambulatory Visit | Attending: Certified Nurse Midwife | Admitting: Certified Nurse Midwife

## 2017-11-06 ENCOUNTER — Inpatient Hospital Stay: Payer: BC Managed Care – PPO | Admitting: Anesthesiology

## 2017-11-06 ENCOUNTER — Encounter: Payer: Self-pay | Admitting: Obstetrics and Gynecology

## 2017-11-06 ENCOUNTER — Other Ambulatory Visit: Payer: Self-pay

## 2017-11-06 DIAGNOSIS — Z3A39 39 weeks gestation of pregnancy: Secondary | ICD-10-CM | POA: Diagnosis not present

## 2017-11-06 DIAGNOSIS — O24420 Gestational diabetes mellitus in childbirth, diet controlled: Principal | ICD-10-CM

## 2017-11-06 DIAGNOSIS — Z3490 Encounter for supervision of normal pregnancy, unspecified, unspecified trimester: Secondary | ICD-10-CM | POA: Diagnosis present

## 2017-11-06 HISTORY — DX: Other specified health status: Z78.9

## 2017-11-06 LAB — CBC
HCT: 31.8 % — ABNORMAL LOW (ref 35.0–47.0)
Hemoglobin: 11 g/dL — ABNORMAL LOW (ref 12.0–16.0)
MCH: 28.6 pg (ref 26.0–34.0)
MCHC: 34.6 g/dL (ref 32.0–36.0)
MCV: 82.7 fL (ref 80.0–100.0)
PLATELETS: 185 10*3/uL (ref 150–440)
RBC: 3.85 MIL/uL (ref 3.80–5.20)
RDW: 13.6 % (ref 11.5–14.5)
WBC: 5.9 10*3/uL (ref 3.6–11.0)

## 2017-11-06 LAB — COMPREHENSIVE METABOLIC PANEL
ALT: 11 U/L (ref 0–44)
AST: 21 U/L (ref 15–41)
Albumin: 3 g/dL — ABNORMAL LOW (ref 3.5–5.0)
Alkaline Phosphatase: 175 U/L — ABNORMAL HIGH (ref 38–126)
Anion gap: 9 (ref 5–15)
BUN: 6 mg/dL (ref 6–20)
CHLORIDE: 106 mmol/L (ref 98–111)
CO2: 20 mmol/L — AB (ref 22–32)
CREATININE: 0.65 mg/dL (ref 0.44–1.00)
Calcium: 8.6 mg/dL — ABNORMAL LOW (ref 8.9–10.3)
GFR calc Af Amer: >60 mL/min (ref >60)
GFR calc non Af Amer: >60 mL/min (ref >60)
Glucose, Bld: 119 mg/dL — ABNORMAL HIGH (ref 70–99)
Potassium: 3.5 mmol/L (ref 3.5–5.1)
SODIUM: 135 mmol/L (ref 135–145)
Total Bilirubin: 0.4 mg/dL (ref 0.3–1.2)
Total Protein: 6.1 g/dL — ABNORMAL LOW (ref 6.5–8.1)

## 2017-11-06 LAB — GLUCOSE, CAPILLARY
GLUCOSE-CAPILLARY: 72 mg/dL (ref 70–99)
GLUCOSE-CAPILLARY: 59 mg/dL — AB (ref 70–99)
Glucose-Capillary: 65 mg/dL — ABNORMAL LOW (ref 70–99)

## 2017-11-06 LAB — TYPE AND SCREEN
ABO/RH(D): O POS
ANTIBODY SCREEN: NEGATIVE

## 2017-11-06 MED ORDER — SOD CITRATE-CITRIC ACID 500-334 MG/5ML PO SOLN: 30.0000 mL | ORAL | Status: DC | PRN

## 2017-11-06 MED ORDER — ACETAMINOPHEN 325 MG PO TABS: 650.0000 mg | ORAL_TABLET | ORAL | Status: DC | PRN

## 2017-11-06 MED ORDER — TERBUTALINE SULFATE 1 MG/ML IJ SOLN: 0.2500 mg | Freq: Once | INTRAMUSCULAR | Status: DC | PRN

## 2017-11-06 MED ORDER — LIDOCAINE HCL (PF) 1 % IJ SOLN
INTRAMUSCULAR | Status: DC | PRN
  Administered 2017-11-06 (×2): 2 mL

## 2017-11-06 MED ORDER — LACTATED RINGERS IV SOLN
500.0000 mL | INTRAVENOUS | Status: DC | PRN
  Administered 2017-11-06: 500 mL via INTRAVENOUS

## 2017-11-06 MED ORDER — BUTORPHANOL TARTRATE 2 MG/ML IJ SOLN: 1.0000 mg | INTRAMUSCULAR | Status: DC | PRN

## 2017-11-06 MED ORDER — OXYTOCIN 40 UNITS IN LACTATED RINGERS INFUSION - SIMPLE MED
2.5000 [IU]/h | INTRAVENOUS | Status: DC
  Administered 2017-11-06: 2.5 [IU]/h via INTRAVENOUS
  Filled 2017-11-06: qty 1000

## 2017-11-06 MED ORDER — OXYTOCIN 10 UNIT/ML IJ SOLN
INTRAMUSCULAR | Status: AC
  Filled 2017-11-06: qty 2

## 2017-11-06 MED ORDER — ONDANSETRON HCL 4 MG PO TABS: 4.0000 mg | ORAL_TABLET | ORAL | Status: DC | PRN

## 2017-11-06 MED ORDER — LACTATED RINGERS IV SOLN
500.0000 mL | Freq: Once | INTRAVENOUS | Status: AC
  Administered 2017-11-06: 500 mL via INTRAVENOUS

## 2017-11-06 MED ORDER — OXYTOCIN BOLUS FROM INFUSION
500.0000 mL | Freq: Once | INTRAVENOUS | Status: AC
  Administered 2017-11-06: 500 mL via INTRAVENOUS

## 2017-11-06 MED ORDER — ONDANSETRON HCL 4 MG/2ML IJ SOLN: 4.0000 mg | Freq: Four times a day (QID) | INTRAMUSCULAR | Status: DC | PRN

## 2017-11-06 MED ORDER — LACTATED RINGERS IV SOLN
INTRAVENOUS | Status: DC
  Administered 2017-11-06: 07:00:00 via INTRAVENOUS

## 2017-11-06 MED ORDER — EPHEDRINE 5 MG/ML INJ: 10.0000 mg | INTRAVENOUS | Status: DC | PRN

## 2017-11-06 MED ORDER — DIPHENHYDRAMINE HCL 25 MG PO CAPS: 25.0000 mg | ORAL_CAPSULE | Freq: Four times a day (QID) | ORAL | Status: DC | PRN

## 2017-11-06 MED ORDER — MISOPROSTOL 200 MCG PO TABS
ORAL_TABLET | ORAL | Status: AC
  Filled 2017-11-06: qty 4

## 2017-11-06 MED ORDER — OXYCODONE-ACETAMINOPHEN 5-325 MG PO TABS: 2.0000 | ORAL_TABLET | ORAL | Status: DC | PRN

## 2017-11-06 MED ORDER — OXYTOCIN 40 UNITS IN LACTATED RINGERS INFUSION - SIMPLE MED
1.0000 m[IU]/min | INTRAVENOUS | Status: DC
  Administered 2017-11-06: 2 m[IU]/min via INTRAVENOUS

## 2017-11-06 MED ORDER — MISOPROSTOL 50MCG HALF TABLET
50.0000 ug | ORAL_TABLET | ORAL | Status: DC
  Administered 2017-11-06: 50 ug via VAGINAL
  Filled 2017-11-06: qty 1

## 2017-11-06 MED ORDER — BUPIVACAINE HCL (PF) 0.25 % IJ SOLN
INTRAMUSCULAR | Status: DC | PRN
  Administered 2017-11-06 (×2): 5 mL via EPIDURAL

## 2017-11-06 MED ORDER — PHENYLEPHRINE 40 MCG/ML (10ML) SYRINGE FOR IV PUSH (FOR BLOOD PRESSURE SUPPORT): 80.0000 ug | PREFILLED_SYRINGE | INTRAVENOUS | Status: DC | PRN

## 2017-11-06 MED ORDER — FENTANYL 2.5 MCG/ML W/ROPIVACAINE 0.15% IN NS 100 ML EPIDURAL (ARMC)
EPIDURAL | Status: AC
  Filled 2017-11-06: qty 100

## 2017-11-06 MED ORDER — LIDOCAINE HCL (PF) 1 % IJ SOLN: 30.0000 mL | INTRAMUSCULAR | Status: DC | PRN

## 2017-11-06 MED ORDER — IBUPROFEN 600 MG PO TABS
600.0000 mg | ORAL_TABLET | Freq: Four times a day (QID) | ORAL | Status: DC
  Administered 2017-11-06 – 2017-11-07 (×4): 600 mg via ORAL
  Filled 2017-11-06 (×4): qty 1

## 2017-11-06 MED ORDER — BENZOCAINE-MENTHOL 20-0.5 % EX AERO: 1.0000 "application " | INHALATION_SPRAY | CUTANEOUS | Status: DC | PRN

## 2017-11-06 MED ORDER — OXYCODONE-ACETAMINOPHEN 5-325 MG PO TABS: 1.0000 | ORAL_TABLET | ORAL | Status: DC | PRN

## 2017-11-06 MED ORDER — ZOLPIDEM TARTRATE 5 MG PO TABS: 5.0000 mg | ORAL_TABLET | Freq: Every evening | ORAL | Status: DC | PRN

## 2017-11-06 MED ORDER — LIDOCAINE-EPINEPHRINE (PF) 1.5 %-1:200000 IJ SOLN
INTRAMUSCULAR | Status: DC | PRN
  Administered 2017-11-06: 3 mL via PERINEURAL

## 2017-11-06 MED ORDER — AMMONIA AROMATIC IN INHA
RESPIRATORY_TRACT | Status: AC
  Filled 2017-11-06: qty 10

## 2017-11-06 MED ORDER — DIPHENHYDRAMINE HCL 50 MG/ML IJ SOLN: 12.5000 mg | INTRAMUSCULAR | Status: DC | PRN

## 2017-11-06 MED ORDER — LIDOCAINE HCL (PF) 1 % IJ SOLN
INTRAMUSCULAR | Status: AC
  Filled 2017-11-06: qty 30

## 2017-11-06 MED ORDER — FENTANYL 2.5 MCG/ML W/ROPIVACAINE 0.15% IN NS 100 ML EPIDURAL (ARMC)
12.0000 mL/h | EPIDURAL | Status: DC
  Administered 2017-11-06: 12 mL/h via EPIDURAL

## 2017-11-06 MED ORDER — ONDANSETRON HCL 4 MG/2ML IJ SOLN: 4.0000 mg | INTRAMUSCULAR | Status: DC | PRN

## 2017-11-06 NOTE — H&P (Signed)
Obstetric History and Physical  Erica Gibson is a 27 y.o. G2P1001 with IUP at [redacted]w[redacted]d, dated by LMP and verified by first trimester ultrasound, for induction of labor due to gestational diabetes-diet controlled. Patient states she has been having  Irregular contractions, none vaginal bleeding, intact membranes, with active fetal movement.    Denies difficulty breathing or respiratory distress, chest pain, abdominal pain, dysuria, and leg pain or swelling.   Prenatal Course  Source of Care: [facility]-initial visit at 9 weeks, total visits: 12  Pregnancy complications or risks: Gestational diabetes-diet controlled, History of gestational diabetes in previous pregnancy, Anterior placenta  Prenatal labs and studies:  ABO, Rh: --/--/O POS (07/15 0550)  Antibody: NEG (07/15 0550)  Rubella: 6.49 (12/14 1610)  Varicella: 444 (12/14 0934)  RPR: Non Reactive (05/07 0937)   HBsAg: Negative (12/14 0934)   HIV: Non Reactive (12/14 0934)   RUE:[id number] (06/21 1011)  1 hr Glucola: 146 (05/07 0937)  3 hr Glucose Tolerance Test: 78, 160, 156, 151 (05/10 1419)  Genetic screening: Low risk female (01/04)  Anatomy US: Complete, normal (03/05 1408)  Past Medical History:  Diagnosis Date  . Medical history non-contributory     Past Surgical History:  Procedure Laterality Date  . NO PAST SURGERIES      OB History  Gravida Para Term Preterm AB Living  2 1 1     1   SAB TAB Ectopic Multiple Live Births        0 1    # Outcome Date GA Lbr Len/2nd Weight Sex Delivery Anes PTL Lv  2 Current           1 Term 07/08/15 [redacted]w[redacted]d / 01:49  F Vag-Spont EPI  LIV    Social History   Socioeconomic History  . Marital status: Single    Spouse name: Not on file  . Number of children: Not on file  . Years of education: Not on file  . Highest education level: Not on file  Occupational History  . Not on file  Social Needs  . Financial resource strain: Not on file  . Food insecurity:    Worry:  Not on file    Inability: Not on file  . Transportation needs:    Medical: Not on file    Non-medical: Not on file  Tobacco Use  . Smoking status: Never Smoker  . Smokeless tobacco: Never Used  Substance and Sexual Activity  . Alcohol use: No  . Drug use: No  . Sexual activity: Yes    Birth control/protection: None, Pill  Lifestyle  . Physical activity:    Days per week: Not on file    Minutes per session: Not on file  . Stress: Not on file  Relationships  . Social connections:    Talks on phone: Not on file    Gets together: Not on file    Attends religious service: Not on file    Active member of club or organization: Not on file    Attends meetings of clubs or organizations: Not on file    Relationship status: Not on file  Other Topics Concern  . Not on file  Social History Narrative  . Not on file    Family History  Problem Relation Age of Onset  . Hyperlipidemia Father     Medications Prior to Admission  Medication Sig Dispense Refill Last Dose  . glucose blood test strip PT IS TO TEST THREE TIMES PER DAY 100 each 12 Taking  .  Prenatal Vit-Fe Fumarate-FA (PRENATAL MULTIVITAMIN) TABS tablet Take 1 tablet daily at 12 noon by mouth.   Taking    No Known Allergies  Review of Systems: Negative except for what is mentioned in HPI.  Physical Exam:  Temp:  [97.5 F (36.4 C)] 97.5 F (36.4 C) (07/15 1237) Pulse Rate:  [85-94] 85 (07/15 1237) Resp:  [16] 16 (07/15 1237) BP: (106-120)/(73-76) 106/73 (07/15 1237) Weight:  [201 lb (91.2 kg)] 201 lb (91.2 kg) (07/15 0647)  GENERAL: Well-developed, well-nourished female in no acute distress.   LUNGS: Clear to auscultation bilaterally.   HEART: Regular rate and rhythm.  ABDOMEN: Soft, nontender, nondistended, gravid.  EXTREMITIES: Nontender, no edema, 2+ distal pulses.  Cervical Exam: Dilation: 4.5 Effacement (%): 60 Station: -1 Exam by:: Kmarion Rawl CNM AROM: Clear fluid, moderate amount  FHT:  Baseline rate  135 bpm   Variability moderate  Accelerations present   Decelerations none  Contractions: Every two (2) to three (3) minutes, soft resting tone   Pertinent Labs/Studies:    Results for orders placed or performed during the hospital encounter of 11/06/17 (from the past 24 hour(s))  CBC     Status: Abnormal   Collection Time: 11/06/17  5:50 AM  Result Value Ref Range   WBC 5.9 3.6 - 11.0 K/uL   RBC 3.85 3.80 - 5.20 MIL/uL   Hemoglobin 11.0 (L) 12.0 - 16.0 g/dL   HCT 86.531.8 (L) 78.435.0 - 69.647.0 %   MCV 82.7 80.0 - 100.0 fL   MCH 28.6 26.0 - 34.0 pg   MCHC 34.6 32.0 - 36.0 g/dL   RDW 29.513.6 28.411.5 - 13.214.5 %   Platelets 185 150 - 440 K/uL  Comprehensive metabolic panel     Status: Abnormal   Collection Time: 11/06/17  5:50 AM  Result Value Ref Range   Sodium 135 135 - 145 mmol/L   Potassium 3.5 3.5 - 5.1 mmol/L   Chloride 106 98 - 111 mmol/L   CO2 20 (L) 22 - 32 mmol/L   Glucose, Bld 119 (H) 70 - 99 mg/dL   BUN 6 6 - 20 mg/dL   Creatinine, Ser 4.400.65 0.44 - 1.00 mg/dL   Calcium 8.6 (L) 8.9 - 10.3 mg/dL   Total Protein 6.1 (L) 6.5 - 8.1 g/dL   Albumin 3.0 (L) 3.5 - 5.0 g/dL   AST 21 15 - 41 U/L   ALT 11 0 - 44 U/L   Alkaline Phosphatase 175 (H) 38 - 126 U/L   Total Bilirubin 0.4 0.3 - 1.2 mg/dL   GFR calc non Af Amer >60 >60 mL/min   GFR calc Af Amer >60 >60 mL/min   Anion gap 9 5 - 15  Type and screen     Status: None   Collection Time: 11/06/17  5:50 AM  Result Value Ref Range   ABO/RH(D) O POS    Antibody Screen NEG    Sample Expiration      11/09/2017 Performed at [facility], [address] [place], [place] [address]     Assessment :  Jackey LogeChandler N Belvedere is a 27 y.o. G2P1001 at 3921w3d being admitted for labor, Rh positive, GBS negative  FHR Category I  Plan:  Admit to birthing suites, see orders.   AROM without difficulty.   Labor: Expectant management.  Induction/Augmentation as needed, per protocol  Delivery plan: Hopeful for vaginal delivery.   Dr.  Valentino Saxonherry aware of admission and plan of care.    Gunnar BullaJenkins Michelle Marlane Hirschmann, CNM [facility]

## 2017-11-06 NOTE — Anesthesia Preprocedure Evaluation (Signed)
Anesthesia Evaluation  Patient identified by MRN, date of birth, ID band Patient awake    Reviewed: Allergy & Precautions, H&P , Patient's Chart, lab work & pertinent test results  History of Anesthesia Complications Negative for: history of anesthetic complications  Airway Mallampati: II  TM Distance: >3 FB Neck ROM: full    Dental no notable dental hx.    Pulmonary neg pulmonary ROS,    Pulmonary exam normal        Cardiovascular negative cardio ROS Normal cardiovascular exam     Neuro/Psych negative neurological ROS  negative psych ROS   GI/Hepatic negative GI ROS, Neg liver ROS,   Endo/Other  negative endocrine ROS  Renal/GU negative Renal ROS  negative genitourinary   Musculoskeletal   Abdominal   Peds  Hematology negative hematology ROS (+)   Anesthesia Other Findings   Reproductive/Obstetrics (+) Pregnancy                             Anesthesia Physical Anesthesia Plan  ASA: II  Anesthesia Plan:    Post-op Pain Management:    Induction:   PONV Risk Score and Plan:   Airway Management Planned:   Additional Equipment:   Intra-op Plan:   Post-operative Plan:   Informed Consent: I have reviewed the patients History and Physical, chart, labs and discussed the procedure including the risks, benefits and alternatives for the proposed anesthesia with the patient or authorized representative who has indicated his/her understanding and acceptance.     Plan Discussed with: Anesthesiologist and CRNA  Anesthesia Plan Comments:         Anesthesia Quick Evaluation

## 2017-11-06 NOTE — Anesthesia Procedure Notes (Signed)
Epidural Patient location during procedure: OB Start time: 11/06/2017 3:06 PM End time: 11/06/2017 3:55 PM  Staffing Resident/CRNA: Junious SilkNoles, Osten Janek, CRNA Performed: resident/CRNA   Preanesthetic Checklist Completed: patient identified, site marked, surgical consent, pre-op evaluation, timeout performed, IV checked, risks and benefits discussed and monitors and equipment checked  Epidural Patient position: sitting Prep: Betadine Patient monitoring: heart rate, continuous pulse ox and blood pressure Approach: midline Location: L3-L4 Injection technique: LOR saline  Needle:  Needle type: Tuohy  Needle gauge: 17 G Needle length: 9 cm and 9 Catheter type: closed end flexible Catheter size: 20 Guage Test dose: negative and 1.5% lidocaine with Epi 1:200 K  Assessment Sensory level: T10 Events: blood not aspirated, injection not painful, no injection resistance, negative IV test and no paresthesia  Additional Notes   Patient tolerated the insertion well without complications.Reason for block:procedure for pain

## 2017-11-07 LAB — CBC
HCT: 30.1 % — ABNORMAL LOW (ref 35.0–47.0)
HEMOGLOBIN: 10.7 g/dL — AB (ref 12.0–16.0)
MCH: 29.3 pg (ref 26.0–34.0)
MCHC: 35.5 g/dL (ref 32.0–36.0)
MCV: 82.3 fL (ref 80.0–100.0)
PLATELETS: 167 10*3/uL (ref 150–440)
RBC: 3.66 MIL/uL — AB (ref 3.80–5.20)
RDW: 13.2 % (ref 11.5–14.5)
WBC: 7 10*3/uL (ref 3.6–11.0)

## 2017-11-07 LAB — RPR: RPR Ser Ql: NONREACTIVE

## 2017-11-07 LAB — COMPREHENSIVE METABOLIC PANEL
ALBUMIN: 2.6 g/dL — AB (ref 3.5–5.0)
ALT: 10 U/L (ref 0–44)
AST: 22 U/L (ref 15–41)
Alkaline Phosphatase: 155 U/L — ABNORMAL HIGH (ref 38–126)
Anion gap: 7 (ref 5–15)
BUN: 7 mg/dL (ref 6–20)
CHLORIDE: 105 mmol/L (ref 98–111)
CO2: 24 mmol/L (ref 22–32)
CREATININE: 0.49 mg/dL (ref 0.44–1.00)
Calcium: 8.6 mg/dL — ABNORMAL LOW (ref 8.9–10.3)
GFR calc non Af Amer: >60 mL/min (ref >60)
Glucose, Bld: 82 mg/dL (ref 70–99)
Potassium: 3.8 mmol/L (ref 3.5–5.1)
SODIUM: 136 mmol/L (ref 135–145)
Total Bilirubin: 0.6 mg/dL (ref 0.3–1.2)
Total Protein: 5.4 g/dL — ABNORMAL LOW (ref 6.5–8.1)

## 2017-11-07 MED ORDER — SENNOSIDES-DOCUSATE SODIUM 8.6-50 MG PO TABS
2.0000 | ORAL_TABLET | ORAL | Status: DC
  Administered 2017-11-07: 2 via ORAL
  Filled 2017-11-07: qty 2

## 2017-11-07 MED ORDER — SODIUM CHLORIDE 0.9% FLUSH: 3.0000 mL | Freq: Two times a day (BID) | INTRAVENOUS | Status: DC

## 2017-11-07 MED ORDER — PRENATAL MULTIVITAMIN CH
1.0000 | ORAL_TABLET | Freq: Every day | ORAL | Status: DC
  Administered 2017-11-07: 1 via ORAL
  Filled 2017-11-07: qty 1

## 2017-11-07 MED ORDER — DIBUCAINE 1 % RE OINT: 1.0000 "application " | TOPICAL_OINTMENT | RECTAL | Status: DC | PRN

## 2017-11-07 MED ORDER — SIMETHICONE 80 MG PO CHEW: 80.0000 mg | CHEWABLE_TABLET | ORAL | Status: DC | PRN

## 2017-11-07 MED ORDER — COCONUT OIL OIL: 1.0000 "application " | TOPICAL_OIL | Status: DC | PRN

## 2017-11-07 MED ORDER — WITCH HAZEL-GLYCERIN EX PADS: 1.0000 "application " | MEDICATED_PAD | CUTANEOUS | Status: DC | PRN

## 2017-11-07 MED ORDER — SODIUM CHLORIDE 0.9% FLUSH: 3.0000 mL | INTRAVENOUS | Status: DC | PRN

## 2017-11-07 MED ORDER — IBUPROFEN 600 MG PO TABS: 600.0000 mg | ORAL_TABLET | Freq: Four times a day (QID) | ORAL | 0 refills | Status: DC

## 2017-11-07 MED ORDER — SODIUM CHLORIDE 0.9 % IV SOLN: 250.0000 mL | INTRAVENOUS | Status: DC | PRN

## 2017-11-07 NOTE — Progress Notes (Signed)
Patient ID: Jackey Logehandler N Brecheisen, female   DOB: Dec 20, 1990, 27 y.o.   MRN: [id number]  Post Partum Day # 1, s/p SVD  Subjective:  Doing well, no questions or concerns. Sitting up in bed, infant resting in bassinet. FOB sleeping at bedside.   Denies difficulty breathing or respiratory distress, chest pain, abdominal pain, excessive vaginal bleeding, dysuria, and leg pain or swelling.   Objective:  Temp:  [97.5 F (36.4 C)-98.3 F (36.8 C)] 98.3 F (36.8 C) (07/16 0239) Pulse Rate:  [74-195] 110 (07/16 0239) Resp:  [16-20] 20 (07/16 0239) BP: (84-119)/(42-99) 107/78 (07/16 0239) SpO2:  [92 %-100 %] 99 % (07/16 0239)  Physical Exam:   General: alert and cooperative   Lungs: clear to auscultation bilaterally  Breasts: deffered, no complaints  Heart: normal apical impulse  Abdomen: soft, non-tender; bowel sounds normal; no masses,  no organomegaly  Pelvis: Lochia: appropriate, Uterine Fundus: firm  Extremities: DVT Evaluation: No evidence of DVT seen on physical exam. Negative Homan's sign.  Recent Labs    11/06/17 0550 11/07/17 0541  HGB 11.0* 10.7*  HCT 31.8* 30.1*    Assessment/Plan: Plan for discharge tomorrow, Breastfeeding and Circumcision prior to discharge   LOS: 1 day    Gunnar BullaJenkins Michelle Keithon Mccoin, CNM [facility] 11/07/2017 7:08 AM

## 2017-11-07 NOTE — Lactation Note (Signed)
This note was copied from a baby's chart. Lactation Consultation Note   Patient Name: Erica Gibson Today's Date: 11/07/2017     During LC rounds, Mom was busy so I left LC contact info with her and encouraged her to contact me when it is a better time if she needs any help or teaching.    Maternal Data    Feeding    LATCH Score                   Interventions    Lactation Tools Discussed/Used     Consult Status      Sunday CornSandra Clark Rollande Thursby 11/07/2017, 2:44 PM

## 2017-11-07 NOTE — Anesthesia Postprocedure Evaluation (Signed)
Anesthesia Post Note  Patient: Erica Gibson  Procedure(s) Performed: AN AD HOC LABOR EPIDURAL  Patient location during evaluation: Mother Baby Anesthesia Type: Epidural Level of consciousness: awake and alert Pain management: pain level controlled Vital Signs Assessment: post-procedure vital signs reviewed and stable Respiratory status: spontaneous breathing, nonlabored ventilation and respiratory function stable Cardiovascular status: stable Postop Assessment: no headache, no backache and epidural receding Anesthetic complications: no     Last Vitals:  Vitals:   11/06/17 2311 11/07/17 0239  BP: 112/74 107/78  Pulse: (!) 101 (!) 110  Resp: 17 20  Temp: 36.8 C 36.8 C  SpO2: 100% 99%    Last Pain:  Vitals:   11/07/17 0239  TempSrc: Oral  PainSc:                  Starling Mannsurtis,  Tanita Palinkas A

## 2017-11-07 NOTE — Discharge Instructions (Signed)
Postpartum Care After Vaginal Delivery ° °The period of time right after you deliver your newborn is called the postpartum period. °What kind of medical care will I receive? °· You may continue to receive fluids and medicines through an IV tube inserted into one of your veins. °· If an incision was made near your vagina (episiotomy) or if you had some vaginal tearing during delivery, cold compresses may be placed on your episiotomy or your tear. This helps to reduce pain and swelling. °· You may be given a squirt bottle to use when you go to the bathroom. You may use this until you are comfortable wiping as usual. To use the squirt bottle, follow these steps: °? Before you urinate, fill the squirt bottle with warm water. Do not use hot water. °? After you urinate, while you are sitting on the toilet, use the squirt bottle to rinse the area around your urethra and vaginal opening. This rinses away any urine and blood. °? You may do this instead of wiping. As you start healing, you may use the squirt bottle before wiping yourself. Make sure to wipe gently. °? Fill the squirt bottle with clean water every time you use the bathroom. °· You will be given sanitary pads to wear. °How can I expect to feel? °· You may not feel the need to urinate for several hours after delivery. °· You will have some soreness and pain in your abdomen and vagina. °· If you are breastfeeding, you may have uterine contractions every time you breastfeed for up to several weeks postpartum. Uterine contractions help your uterus return to its normal size. °· It is normal to have vaginal bleeding (lochia) after delivery. The amount and appearance of lochia is often similar to a menstrual period in the first week after delivery. It will gradually decrease over the next few weeks to a dry, yellow-brown discharge. For most women, lochia stops completely by 6-8 weeks after delivery. Vaginal bleeding can vary from woman to woman. °· Within the first few  days after delivery, you may have breast engorgement. This is when your breasts feel heavy, full, and uncomfortable. Your breasts may also throb and feel hard, tightly stretched, warm, and tender. After this occurs, you may have milk leaking from your breasts. Your health care provider can help you relieve discomfort due to breast engorgement. Breast engorgement should go away within a few days. °· You may feel more sad or worried than normal due to hormonal changes after delivery. These feelings should not last more than a few days. If these feelings do not go away after several days, speak with your health care provider. °How should I care for myself? °· Tell your health care provider if you have pain or discomfort. °· Drink enough water to keep your urine clear or pale yellow. °· Wash your hands thoroughly with soap and water for at least 20 seconds after changing your sanitary pads, after using the toilet, and before holding or feeding your baby. °· If you are not breastfeeding, avoid touching your breasts a lot. Doing this can make your breasts produce more milk. °· If you become weak or lightheaded, or you feel like you might faint, ask for help before: °? Getting out of bed. °? Showering. °· Change your sanitary pads frequently. Watch for any changes in your flow, such as a sudden increase in volume, a change in color, the passing of large blood clots. If you pass a blood clot from your vagina,   save it to show to your health care provider. Do not flush blood clots down the toilet without having your health care provider look at them. °· Make sure that all your vaccinations are up to date. This can help protect you and your baby from getting certain diseases. You may need to have immunizations done before you leave the hospital. °· If desired, talk with your health care provider about methods of family planning or birth control (contraception). °How can I start bonding with my baby? °Spending as much time as  possible with your baby is very important. During this time, you and your baby can get to know each other and develop a bond. Having your baby stay with you in your room (rooming in) can give you time to get to know your baby. Rooming in can also help you become comfortable caring for your baby. Breastfeeding can also help you bond with your baby. °How can I plan for returning home with my baby? °· Make sure that you have a car seat installed in your vehicle. °? Your car seat should be checked by a certified car seat installer to make sure that it is installed safely. °? Make sure that your baby fits into the car seat safely. °· Ask your health care provider any questions you have about caring for yourself or your baby. Make sure that you are able to contact your health care provider with any questions after leaving the hospital. °This information is not intended to replace advice given to you by your health care provider. Make sure you discuss any questions you have with your health care provider. °Document Released: 02/06/2007 Document Revised: 09/14/2015 Document Reviewed: 03/16/2015 °Elsevier Interactive Patient Education © 2018 [employer] ° ° °Postpartum Depression and Baby Blues °The postpartum period begins right after the birth of a baby. During this time, there is often a great amount of joy and excitement. It is also a time of many changes in the life of the parents. Regardless of how many times a mother gives birth, each child brings new challenges and dynamics to the family. It is not unusual to have feelings of excitement along with confusing shifts in moods, emotions, and thoughts. All mothers are at risk of developing postpartum depression or the "baby blues." These mood changes can occur right after giving birth, or they may occur many months after giving birth. The baby blues or postpartum depression can be mild or severe. Additionally, postpartum depression can go away rather quickly, or it can  be a long-term condition. °What are the causes? °Raised hormone levels and the rapid drop in those levels are thought to be a main cause of postpartum depression and the baby blues. A number of hormones change during and after pregnancy. Estrogen and progesterone usually decrease right after the delivery of your baby. The levels of thyroid hormone and various cortisol steroids also rapidly drop. Other factors that play a role in these mood changes include major life events and genetics. °What increases the risk? °If you have any of the following risks for the baby blues or postpartum depression, know what symptoms to watch out for during the postpartum period. Risk factors that may increase the likelihood of getting the baby blues or postpartum depression include: °· Having a personal or family history of depression. °· Having depression while being pregnant. °· Having premenstrual mood issues or mood issues related to oral contraceptives. °· Having a lot of life stress. °· Having marital conflict. °· Lacking   a social support network. °· Having a baby with special needs. °· Having health problems, such as diabetes. ° °What are the signs or symptoms? °Symptoms of baby blues include: °· Brief changes in mood, such as going from extreme happiness to sadness. °· Decreased concentration. °· Difficulty sleeping. °· Crying spells, tearfulness. °· Irritability. °· Anxiety. ° °Symptoms of postpartum depression typically begin within the first month after giving birth. These symptoms include: °· Difficulty sleeping or excessive sleepiness. °· Marked weight loss. °· Agitation. °· Feelings of worthlessness. °· Lack of interest in activity or food. ° °Postpartum psychosis is a very serious condition and can be dangerous. Fortunately, it is rare. Displaying any of the following symptoms is cause for immediate medical attention. Symptoms of postpartum psychosis include: °· Hallucinations and delusions. °· Bizarre or disorganized  behavior. °· Confusion or disorientation. ° °How is this diagnosed? °A diagnosis is made by an evaluation of your symptoms. There are no medical or lab tests that lead to a diagnosis, but there are various questionnaires that a health care provider may use to identify those with the baby blues, postpartum depression, or psychosis. Often, a screening tool called the Edinburgh Postnatal Depression Scale is used to diagnose depression in the postpartum period. °How is this treated? °The baby blues usually goes away on its own in 1-2 weeks. Social support is often all that is needed. You will be encouraged to get adequate sleep and rest. Occasionally, you may be given medicines to help you sleep. °Postpartum depression requires treatment because it can last several months or longer if it is not treated. Treatment may include individual or group therapy, medicine, or both to address any social, physiological, and psychological factors that may play a role in the depression. Regular exercise, a healthy diet, rest, and social support may also be strongly recommended. °Postpartum psychosis is more serious and needs treatment right away. Hospitalization is often needed. °Follow these instructions at home: °· Get as much rest as you can. Nap when the baby sleeps. °· Exercise regularly. Some women find yoga and walking to be beneficial. °· Eat a balanced and nourishing diet. °· Do little things that you enjoy. Have a cup of tea, take a bubble bath, read your favorite magazine, or listen to your favorite music. °· Avoid alcohol. °· Ask for help with household chores, cooking, grocery shopping, or running errands as needed. Do not try to do everything. °· Talk to people close to you about how you are feeling. Get support from your partner, family members, friends, or other new moms. °· Try to stay positive in how you think. Think about the things you are grateful for. °· Do not spend a lot of time alone. °· Only take  over-the-counter or prescription medicine as directed by your health care provider. °· Keep all your postpartum appointments. °· Let your health care provider know if you have any concerns. °Contact a health care provider if: °You are having a reaction to or problems with your medicine. °Get help right away if: °· You have suicidal feelings. °· You think you may harm the baby or someone else. °This information is not intended to replace advice given to you by your health care provider. Make sure you discuss any questions you have with your health care provider. °Document Released: 01/14/2004 Document Revised: 09/17/2015 Document Reviewed: 01/21/2013 °Elsevier Interactive Patient Education © 2017 [employer] ° ° °

## 2017-11-07 NOTE — Discharge Summary (Signed)
  Obstetric Discharge Summary  Patient ID: Erica Gibson Allum MRN: [id number] DOB/AGE: April 12, 1991 27 y.o.   Date of Admission: 11/06/2017  Date of Discharge: 11/07/17  Admitting Diagnosis: Induction of labor at 4496w4d  Secondary Diagnosis: Gestational diabetes diet controlled (A1)  Mode of Delivery: normal spontaneous vaginal delivery     Discharge Diagnosis: No other diagnosis   Intrapartum Procedures: Atificial rupture of membranes, epidural and pitocin augmentation   Post partum procedures: None  Complications: None   Brief Hospital Course   Erica Gibson Comella is a G2P1001 who had a SVD on 11/06/2017;  for further details of this surgery, please refer to the delivey note.  Patient had an uncomplicated postpartum course.  By time of discharge on PPD#1, her pain was controlled on oral pain medications; she had appropriate lochia and was ambulating, voiding without difficulty and tolerating regular diet.  She was deemed stable for discharge to home.    Labs:  CBC Latest Ref Rng & Units 11/07/2017 11/06/2017 08/29/2017  WBC 3.6 - 11.0 K/uL 7.0 5.9 7.1  Hemoglobin 12.0 - 16.0 g/dL 10.7(L) 11.0(L) 11.4  Hematocrit 35.0 - 47.0 % 30.1(L) 31.8(L) 34.6  Platelets 150 - 440 K/uL 167 185 236   O POS  Physical exam:   Temp:  [97.7 F (36.5 C)-98.3 F (36.8 C)] 98 F (36.7 C) (07/16 1702) Pulse Rate:  [82-195] 89 (07/16 1702) Resp:  [17-20] 18 (07/16 1702) BP: (92-119)/(50-99) 92/55 (07/16 1702) SpO2:  [98 %-100 %] 98 % (07/16 1702)  General: alert and no distress  Lochia: appropriate  Abdomen: soft, NT  Uterine Fundus: firm  Perineum: healing well, no significant drainage, no dehiscence, no significant erythema  Extremities: No evidence of DVT seen on physical exam. No lower extremity edema.  Discharge Instructions: Per After Visit Summary.  Activity: Advance as tolerated. Pelvic rest for 6 weeks.  Also refer to After Visit Summary  Diet:  Regular  Medications:  Allergies as of 11/07/2017   No Known Allergies     Medication List    STOP taking these medications   glucose blood test strip     TAKE these medications   ibuprofen 600 MG tablet Commonly known as:  ADVIL,MOTRIN Take 1 tablet (600 mg total) by mouth every 6 (six) hours.   prenatal multivitamin Tabs tablet Take 1 tablet daily at 12 noon by mouth.      Outpatient follow up:  Follow-up Information    Gunnar BullaLawhorn, Nixie Laube Michelle, CNM. Go to.   Specialties:  Certified Nurse Midwife, Obstetrics and Gynecology, Radiology Why:  Please follow up with JML in six (6) weeks for PPV Contact information: [address] [place] [address] [phone]          Discharged Condition: stable  Discharged to: home   Newborn Data:  Disposition:home with mother  Apgars: APGAR (1 MIN): 8   APGAR (5 MINS): 9    Baby Feeding: Breast   Gunnar BullaJenkins Michelle Townes Fuhs, CNM [facility]

## 2017-11-08 NOTE — Progress Notes (Signed)
All discharge instructions reviewed with pt; pt in wheelchair holding baby in arms as RN wheeled pt to car to go home; pt going home with baby and father of baby

## 2017-12-15 ENCOUNTER — Ambulatory Visit (INDEPENDENT_AMBULATORY_CARE_PROVIDER_SITE_OTHER): Payer: BC Managed Care – PPO | Admitting: Certified Nurse Midwife

## 2017-12-15 ENCOUNTER — Encounter: Payer: Self-pay | Admitting: Certified Nurse Midwife

## 2017-12-15 DIAGNOSIS — Z3009 Encounter for other general counseling and advice on contraception: Secondary | ICD-10-CM

## 2017-12-15 NOTE — Progress Notes (Signed)
Subjective:    Erica Gibson is a 27 y.o. 452P2002 Caucasian female who presents for a postpartum visit. She is 6 weeks postpartum following a spontaneous vaginal delivery at 39+3 gestational weeks. Anesthesia: epidural. I have fully reviewed the prenatal and intrapartum course.  Postpartum course has been uncomplicated. Baby's course has been uncomplicated. Baby is feeding by expressed breast milk and formula supplementation. Bleeding no bleeding. Bowel function is normal. Bladder function is normal.   Patient is not sexually active. Contraception method is abstinence. Postpartum depression screening: negative. Score 0.  Last pap 2017 and was normal.  Denies difficulty breathing or respiratory distress, chest pain, abdominal pain, excessive vaginal bleeding, dysuria, and leg pain or swelling.   The following portions of the patient's history were reviewed and updated as appropriate: allergies, current medications, past medical history, past surgical history and problem list.  Review of Systems  Pertinent items are noted in HPI.   Objective:   BP 112/83   Pulse 90   Ht 5\' 6"  (1.676 m)   Wt 174 lb 1 oz (79 kg)   Breastfeeding? Yes   BMI 28.09 kg/m   General:  alert, cooperative and no distress   Breasts:  deferred, no complaints  Lungs: clear to auscultation bilaterally  Heart:  regular rate and rhythm  Abdomen: soft, nontender   Vulva: normal  Vagina: normal vagina  Cervix:  closed  Corpus: Well-involuted  Adnexa:  Non-palpable          Office Visit from 12/15/2017 in [facility]  PHQ-9 Total Score  0     Assessment:   Postpartum exam Six (6) wks s/p spontaneous vaginal birth Breastfeeding with formula supplementation Depression screening Contraception counseling   Plan:   May return to work without restriction.   Desires Mirena IUD insertion.   Reviewed red flag symptoms and when to call.   Follow up in: 2 weeks for IUD insertion or earlier if  needed.    Erica Gibson, CNM [facility]      Erica Gibson, [place]

## 2017-12-15 NOTE — Progress Notes (Signed)
Pt is here for a post partum visit. Breast and bottle feeding.Has not had a period. Would like birth control- unsure what method. Has not resumed intercourse. Screening 0

## 2017-12-15 NOTE — Patient Instructions (Signed)
Levonorgestrel intrauterine device (IUD) What is this medicine? LEVONORGESTREL IUD (LEE voe nor jes trel) is a contraceptive (birth control) device. The device is placed inside the uterus by a healthcare professional. It is used to prevent pregnancy. This device can also be used to treat heavy bleeding that occurs during your period. This medicine may be used for other purposes; ask your health care provider or pharmacist if you have questions. COMMON BRAND NAME(S): Minette Headland What should I tell my health care provider before I take this medicine? They need to know if you have any of these conditions: -abnormal Pap smear -cancer of the breast, uterus, or cervix -diabetes -endometritis -genital or pelvic infection now or in the past -have more than one sexual partner or your partner has more than one partner -heart disease -history of an ectopic or tubal pregnancy -immune system problems -IUD in place -liver disease or tumor -problems with blood clots or take blood-thinners -seizures -use intravenous drugs -uterus of unusual shape -vaginal bleeding that has not been explained -an unusual or allergic reaction to levonorgestrel, other hormones, silicone, or polyethylene, medicines, foods, dyes, or preservatives -pregnant or trying to get pregnant -breast-feeding How should I use this medicine? This device is placed inside the uterus by a health care professional. Talk to your pediatrician regarding the use of this medicine in children. Special care may be needed. Overdosage: If you think you have taken too much of this medicine contact a poison control center or emergency room at once. NOTE: This medicine is only for you. Do not share this medicine with others. What if I miss a dose? This does not apply. Depending on the brand of device you have inserted, the device will need to be replaced every 3 to 5 years if you wish to continue using this type of birth  control. What may interact with this medicine? Do not take this medicine with any of the following medications: -amprenavir -bosentan -fosamprenavir This medicine may also interact with the following medications: -aprepitant -armodafinil -barbiturate medicines for inducing sleep or treating seizures -bexarotene -boceprevir -griseofulvin -medicines to treat seizures like carbamazepine, ethotoin, felbamate, oxcarbazepine, phenytoin, topiramate -modafinil -pioglitazone -rifabutin -rifampin -rifapentine -some medicines to treat HIV infection like atazanavir, efavirenz, indinavir, lopinavir, nelfinavir, tipranavir, ritonavir -St. John's wort -warfarin This list may not describe all possible interactions. Give your health care provider a list of all the medicines, herbs, non-prescription drugs, or dietary supplements you use. Also tell them if you smoke, drink alcohol, or use illegal drugs. Some items may interact with your medicine. What should I watch for while using this medicine? Visit your doctor or health care professional for regular check ups. See your doctor if you or your partner has sexual contact with others, becomes HIV positive, or gets a sexual transmitted disease. This product does not protect you against HIV infection (AIDS) or other sexually transmitted diseases. You can check the placement of the IUD yourself by reaching up to the top of your vagina with clean fingers to feel the threads. Do not pull on the threads. It is a good habit to check placement after each menstrual period. Call your doctor right away if you feel more of the IUD than just the threads or if you cannot feel the threads at all. The IUD may come out by itself. You may become pregnant if the device comes out. If you notice that the IUD has come out use a backup birth control method like condoms and call your  health care provider. Using tampons will not change the position of the IUD and are okay to use  during your period. This IUD can be safely scanned with magnetic resonance imaging (MRI) only under specific conditions. Before you have an MRI, tell your healthcare provider that you have an IUD in place, and which type of IUD you have in place. What side effects may I notice from receiving this medicine? Side effects that you should report to your doctor or health care professional as soon as possible: -allergic reactions like skin rash, itching or hives, swelling of the face, lips, or tongue -fever, flu-like symptoms -genital sores -high blood pressure -no menstrual period for 6 weeks during use -pain, swelling, warmth in the leg -pelvic pain or tenderness -severe or sudden headache -signs of pregnancy -stomach cramping -sudden shortness of breath -trouble with balance, talking, or walking -unusual vaginal bleeding, discharge -yellowing of the eyes or skin Side effects that usually do not require medical attention (report to your doctor or health care professional if they continue or are bothersome): -acne -breast pain -change in sex drive or performance -changes in weight -cramping, dizziness, or faintness while the device is being inserted -headache -irregular menstrual bleeding within first 3 to 6 months of use -nausea This list may not describe all possible side effects. Call your doctor for medical advice about side effects. You may report side effects to FDA at 1-800-FDA-1088. Where should I keep my medicine? This does not apply. NOTE: This sheet is a summary. It may not cover all possible information. If you have questions about this medicine, talk to your doctor, pharmacist, or health care provider.  2018 Elsevier/Gold Standard (2016-01-22 14:14:56) Preventive Care 18-39 Years, Female Preventive care refers to lifestyle choices and visits with your health care provider that can promote health and wellness. What does preventive care include?  A yearly physical exam.  This is also called an annual well check.  Dental exams once or twice a year.  Routine eye exams. Ask your health care provider how often you should have your eyes checked.  Personal lifestyle choices, including: ? Daily care of your teeth and gums. ? Regular physical activity. ? Eating a healthy diet. ? Avoiding tobacco and drug use. ? Limiting alcohol use. ? Practicing safe sex. ? Taking vitamin and mineral supplements as recommended by your health care provider. What happens during an annual well check? The services and screenings done by your health care provider during your annual well check will depend on your age, overall health, lifestyle risk factors, and family history of disease. Counseling Your health care provider may ask you questions about your:  Alcohol use.  Tobacco use.  Drug use.  Emotional well-being.  Home and relationship well-being.  Sexual activity.  Eating habits.  Work and work environment.  Method of birth control.  Menstrual cycle.  Pregnancy history.  Screening You may have the following tests or measurements:  Height, weight, and BMI.  Diabetes screening. This is done by checking your blood sugar (glucose) after you have not eaten for a while (fasting).  Blood pressure.  Lipid and cholesterol levels. These may be checked every 5 years starting at age 20.  Skin check.  Hepatitis C blood test.  Hepatitis B blood test.  Sexually transmitted disease (STD) testing.  BRCA-related cancer screening. This may be done if you have a family history of breast, ovarian, tubal, or peritoneal cancers.  Pelvic exam and Pap test. This may be done every 3   years starting at age 14. Starting at age 51, this may be done every 5 years if you have a Pap test in combination with an HPV test.  Discuss your test results, treatment options, and if necessary, the need for more tests with your health care provider. Vaccines Your health care provider  may recommend certain vaccines, such as:  Influenza vaccine. This is recommended every year.  Tetanus, diphtheria, and acellular pertussis (Tdap, Td) vaccine. You may need a Td booster every 10 years.  Varicella vaccine. You may need this if you have not been vaccinated.  HPV vaccine. If you are 67 or younger, you may need three doses over 6 months.  Measles, mumps, and rubella (MMR) vaccine. You may need at least one dose of MMR. You may also need a second dose.  Pneumococcal 13-valent conjugate (PCV13) vaccine. You may need this if you have certain conditions and were not previously vaccinated.  Pneumococcal polysaccharide (PPSV23) vaccine. You may need one or two doses if you smoke cigarettes or if you have certain conditions.  Meningococcal vaccine. One dose is recommended if you are age 14-21 years and a first-year college student living in a residence hall, or if you have one of several medical conditions. You may also need additional booster doses.  Hepatitis A vaccine. You may need this if you have certain conditions or if you travel or work in places where you may be exposed to hepatitis A.  Hepatitis B vaccine. You may need this if you have certain conditions or if you travel or work in places where you may be exposed to hepatitis B.  Haemophilus influenzae type b (Hib) vaccine. You may need this if you have certain risk factors.  Talk to your health care provider about which screenings and vaccines you need and how often you need them. This information is not intended to replace advice given to you by your health care provider. Make sure you discuss any questions you have with your health care provider. Document Released: 06/07/2001 Document Revised: 12/30/2015 Document Reviewed: 02/10/2015 Elsevier Interactive Patient Education  [employer].

## 2017-12-26 ENCOUNTER — Encounter: Payer: Self-pay | Admitting: Obstetrics and Gynecology

## 2017-12-26 ENCOUNTER — Ambulatory Visit (INDEPENDENT_AMBULATORY_CARE_PROVIDER_SITE_OTHER): Payer: BC Managed Care – PPO | Admitting: Obstetrics and Gynecology

## 2017-12-26 VITALS — BP 116/78 | HR 88 | Ht 66.0 in | Wt 176.2 lb

## 2017-12-26 DIAGNOSIS — Z30430 Encounter for insertion of intrauterine contraceptive device: Secondary | ICD-10-CM

## 2017-12-26 DIAGNOSIS — E663 Overweight: Secondary | ICD-10-CM | POA: Diagnosis not present

## 2017-12-26 DIAGNOSIS — Z6828 Body mass index (BMI) 28.0-28.9, adult: Secondary | ICD-10-CM

## 2017-12-26 MED ORDER — CYANOCOBALAMIN 1000 MCG/ML IJ SOLN: 1000.0000 ug | INTRAMUSCULAR | 1 refills | Status: DC

## 2017-12-26 MED ORDER — PHENTERMINE HCL 37.5 MG PO TABS: 37.5000 mg | ORAL_TABLET | Freq: Every day | ORAL | 2 refills | Status: DC

## 2017-12-26 NOTE — Patient Instructions (Signed)
IUD PLACEMENT POST-PROCEDURE INSTRUCTIONS  1. You may take Ibuprofen, Aleve or Tylenol for pain if needed.  Cramping should resolve within in 24 hours.  2. You may have a small amount of spotting.  You should wear a mini pad for the next few days.  3. You may have intercourse after 24 hours.  If you using this for birth control, it is effective immediately.  4. You need to call if you have any pelvic pain, fever, heavy bleeding or foul smelling vaginal discharge.  Irregular bleeding is common the first several months after having an IUD placed. You do not need to call for this reason unless you are concerned.  5. Shower or bathe as normal  6. You should have a follow-up appointment in 4-8 weeks for a re-check to make sure you are not having any problems.    Exercising to Lose Weight Exercising can help you to lose weight. In order to lose weight through exercise, you need to do vigorous-intensity exercise. You can tell that you are exercising with vigorous intensity if you are breathing very hard and fast and cannot hold a conversation while exercising. Moderate-intensity exercise helps to maintain your current weight. You can tell that you are exercising at a moderate level if you have a higher heart rate and faster breathing, but you are still able to hold a conversation. How often should I exercise? Choose an activity that you enjoy and set realistic goals. Your health care provider can help you to make an activity plan that works for you. Exercise regularly as directed by your health care provider. This may include:  Doing resistance training twice each week, such as: ? Push-ups. ? Sit-ups. ? Lifting weights. ? Using resistance bands.  Doing a given intensity of exercise for a given amount of time. Choose from these options: ? 150 minutes of moderate-intensity exercise every week. ? 75 minutes of vigorous-intensity exercise every week. ? A mix of moderate-intensity and  vigorous-intensity exercise every week.  Children, pregnant women, people who are out of shape, people who are overweight, and older adults may need to consult a health care provider for individual recommendations. If you have any sort of medical condition, be sure to consult your health care provider before starting a new exercise program. What are some activities that can help me to lose weight?  Walking at a rate of at least 4.5 miles an hour.  Jogging or running at a rate of 5 miles per hour.  Biking at a rate of at least 10 miles per hour.  Lap swimming.  Roller-skating or in-line skating.  Cross-country skiing.  Vigorous competitive sports, such as football, basketball, and soccer.  Jumping rope.  Aerobic dancing. How can I be more active in my day-to-day activities?  Use the stairs instead of the elevator.  Take a walk during your lunch break.  If you drive, park your car farther away from work or school.  If you take public transportation, get off one stop early and walk the rest of the way.  Make all of your phone calls while standing up and walking around.  Get up, stretch, and walk around every 30 minutes throughout the day. What guidelines should I follow while exercising?  Do not exercise so much that you hurt yourself, feel dizzy, or get very short of breath.  Consult your health care provider prior to starting a new exercise program.  Wear comfortable clothes and shoes with good support.  Drink plenty of  water while you exercise to prevent dehydration or heat stroke. Body water is lost during exercise and must be replaced.  Work out until you breathe faster and your heart beats faster. This information is not intended to replace advice given to you by your health care provider. Make sure you discuss any questions you have with your health care provider. Document Released: 05/14/2010 Document Revised: 09/17/2015 Document Reviewed: 09/12/2013 Elsevier  Interactive Patient Education  [employer].

## 2017-12-26 NOTE — Progress Notes (Signed)
Erica Gibson is a 27 y.o. year old G35P1001 Caucasian female who presents for placement of a Mirena IUD & desires starting weight loss program. She is no longer Breast feeding infant.  No LMP recorded. BP 116/78   Pulse 88   Ht 5\' 6"  (1.676 m)   Wt 176 lb 3.2 oz (79.9 kg)   Breastfeeding? No   BMI 28.44 kg/m  Last sexual intercourse was two weeks ago, and pregnancy test today was negative  The risks and benefits of the method and placement have been thouroughly reviewed with the patient and all questions were answered.  Specifically the patient is aware of failure rate of 04/998, expulsion of the IUD and of possible perforation.  The patient is aware of irregular bleeding due to the method and understands the incidence of irregular bleeding diminishes with time.  Signed copy of informed consent in chart.   Time out was performed.  A graves speculum was placed in the vagina.  The cervix was visualized, prepped using Betadine, and grasped with a single tooth tenaculum. The uterus was found to be neutral and it sounded to 8 cm.  Mirena IUD placed per manufacturer's recommendations.   The strings were trimmed to 3 cm.  The patient was given post procedure instructions, including signs and symptoms of infection and to check for the strings after each menses or each month, and refraining from intercourse or anything in the vagina for 3 days.  She was given a Mirena care card with date Mirena placed, and date Mirena to be removed.  She was counseled at length regarding weight loss program and side effects, medication use, and expected outcome. RTC in 4 weeks for wt/bp/B12 and IUD check.    Ell Tiso Suzan Nailer, CNM

## 2018-01-23 ENCOUNTER — Encounter: Payer: Self-pay | Admitting: Obstetrics and Gynecology

## 2018-01-23 ENCOUNTER — Ambulatory Visit (INDEPENDENT_AMBULATORY_CARE_PROVIDER_SITE_OTHER): Payer: BC Managed Care – PPO | Admitting: Obstetrics and Gynecology

## 2018-01-23 VITALS — BP 109/76 | HR 88 | Ht 66.0 in | Wt 167.8 lb

## 2018-01-23 DIAGNOSIS — E663 Overweight: Secondary | ICD-10-CM

## 2018-01-23 DIAGNOSIS — Z30431 Encounter for routine checking of intrauterine contraceptive device: Secondary | ICD-10-CM

## 2018-01-23 NOTE — Progress Notes (Signed)
SUBJECTIVE:  27 y.o. here for follow-up weight loss visit & IUD check previously seen 4 weeks ago. Denies any concerns and feels like medication is  Working well. Has lost 9#s in 4 weeks.  Does report continuous spotting since IUD placed, with what she thinks was menses last week.   OBJECTIVE:  BP 109/76   Pulse 88   Ht 5\' 6"  (1.676 m)   Wt 167 lb 12.8 oz (76.1 kg)   BMI 27.08 kg/m   Body mass index is 27.08 kg/m. Patient appears well. IUD string noted and moderate rubra noted  ASSESSMENT:  Overweight- responding well to weight loss plan IUD check PLAN:  To continue with current medications. B12 1021mcg/ml injection given taytulla sample pack given and instructed on use for controlling bleeding.  RTC in 4 weeks as planned  Melody Jay, CNM

## 2018-01-24 ENCOUNTER — Encounter: Payer: BC Managed Care – PPO | Admitting: Obstetrics and Gynecology

## 2018-02-21 ENCOUNTER — Encounter: Payer: BC Managed Care – PPO | Admitting: Obstetrics and Gynecology

## 2018-02-22 ENCOUNTER — Encounter: Payer: BC Managed Care – PPO | Admitting: Obstetrics and Gynecology

## 2018-02-27 ENCOUNTER — Encounter: Payer: Self-pay | Admitting: Obstetrics and Gynecology

## 2018-02-27 ENCOUNTER — Ambulatory Visit (INDEPENDENT_AMBULATORY_CARE_PROVIDER_SITE_OTHER): Payer: BC Managed Care – PPO | Admitting: Obstetrics and Gynecology

## 2018-02-27 VITALS — BP 126/84 | HR 86 | Ht 64.0 in | Wt 163.0 lb

## 2018-02-27 DIAGNOSIS — E663 Overweight: Secondary | ICD-10-CM

## 2018-02-27 MED ORDER — CYANOCOBALAMIN 1000 MCG/ML IJ SOLN
1000.0000 ug | Freq: Once | INTRAMUSCULAR | Status: AC
  Administered 2018-02-27: 1000 ug via INTRAMUSCULAR

## 2018-02-27 NOTE — Progress Notes (Signed)
Pt is here for wt, bp check, b-12 inj She is doing well, denies any s/e  02/27/18 wt- 163lb 01/23/18 wt- 167lb 12/26/17 wt- 176lb  Waist 33 in

## 2018-03-26 ENCOUNTER — Encounter: Payer: Self-pay | Admitting: Surgical

## 2018-03-27 ENCOUNTER — Encounter: Payer: BC Managed Care – PPO | Admitting: Obstetrics and Gynecology

## 2018-03-29 ENCOUNTER — Encounter: Payer: BC Managed Care – PPO | Admitting: Obstetrics and Gynecology

## 2018-04-02 ENCOUNTER — Other Ambulatory Visit: Payer: Self-pay | Admitting: Obstetrics and Gynecology

## 2018-04-26 ENCOUNTER — Encounter: Payer: BC Managed Care – PPO | Admitting: Obstetrics and Gynecology

## 2018-04-26 ENCOUNTER — Other Ambulatory Visit: Payer: Self-pay | Admitting: *Deleted

## 2018-04-26 MED ORDER — PHENTERMINE HCL 37.5 MG PO TABS: 37.5000 mg | ORAL_TABLET | Freq: Every day | ORAL | 2 refills | Status: DC

## 2018-05-24 ENCOUNTER — Ambulatory Visit (INDEPENDENT_AMBULATORY_CARE_PROVIDER_SITE_OTHER): Payer: BC Managed Care – PPO | Admitting: Obstetrics and Gynecology

## 2018-05-24 ENCOUNTER — Encounter: Payer: Self-pay | Admitting: Obstetrics and Gynecology

## 2018-05-24 VITALS — BP 112/84 | HR 104 | Ht 64.0 in | Wt 162.2 lb

## 2018-05-24 DIAGNOSIS — Z6827 Body mass index (BMI) 27.0-27.9, adult: Secondary | ICD-10-CM

## 2018-05-24 DIAGNOSIS — E663 Overweight: Secondary | ICD-10-CM

## 2018-05-24 MED ORDER — CYANOCOBALAMIN 1000 MCG/ML IJ SOLN
1000.0000 ug | Freq: Once | INTRAMUSCULAR | Status: AC
  Administered 2018-05-24: 1000 ug via INTRAMUSCULAR

## 2018-05-24 NOTE — Progress Notes (Signed)
Pt is here for wt, bp check, b-12 inj She is doing well, denies any s/e  05/24/18 wt- 162.2lb 02/27/18 wt- 163lb  Waist 31.5

## 2018-06-12 ENCOUNTER — Other Ambulatory Visit: Payer: Self-pay | Admitting: *Deleted

## 2018-06-20 ENCOUNTER — Other Ambulatory Visit: Payer: Self-pay | Admitting: *Deleted

## 2018-06-20 MED ORDER — PHENTERMINE HCL 37.5 MG PO TABS: 37.5000 mg | ORAL_TABLET | Freq: Every day | ORAL | 2 refills | Status: DC

## 2018-06-21 ENCOUNTER — Encounter: Payer: Self-pay | Admitting: Obstetrics and Gynecology

## 2018-06-21 ENCOUNTER — Ambulatory Visit: Payer: BC Managed Care – PPO | Admitting: Obstetrics and Gynecology

## 2018-06-21 VITALS — BP 120/78 | HR 88 | Ht 64.0 in | Wt 163.0 lb

## 2018-06-21 DIAGNOSIS — E663 Overweight: Secondary | ICD-10-CM

## 2018-06-21 MED ORDER — CYANOCOBALAMIN 1000 MCG/ML IJ SOLN
1000.0000 ug | Freq: Once | INTRAMUSCULAR | Status: AC
  Administered 2018-06-21: 1000 ug via INTRAMUSCULAR

## 2018-06-21 NOTE — Progress Notes (Signed)
Pt is here for wt, bp check, b-12 inj She is doing well, has been out of her phentermine for several weeks, plans to restart  06/21/18 wt- 163lb 05/24/18 wt- 163lb  Waist 31in

## 2021-07-16 ENCOUNTER — Ambulatory Visit (INDEPENDENT_AMBULATORY_CARE_PROVIDER_SITE_OTHER): Payer: Self-pay | Admitting: Obstetrics

## 2021-07-16 ENCOUNTER — Encounter: Payer: Self-pay | Admitting: Obstetrics

## 2021-07-16 ENCOUNTER — Other Ambulatory Visit (HOSPITAL_COMMUNITY)
Admission: RE | Admit: 2021-07-16 | Discharge: 2021-07-16 | Disposition: A | Discharge status: Home / Self Care | Payer: BLUE CROSS/BLUE SHIELD | Source: Ambulatory Visit | Attending: Obstetrics | Admitting: Obstetrics

## 2021-07-16 ENCOUNTER — Other Ambulatory Visit: Payer: Self-pay

## 2021-07-16 VITALS — BP 106/75 | HR 87 | Ht 64.0 in | Wt 203.5 lb

## 2021-07-16 DIAGNOSIS — Z124 Encounter for screening for malignant neoplasm of cervix: Secondary | ICD-10-CM

## 2021-07-16 DIAGNOSIS — Z01419 Encounter for gynecological examination (general) (routine) without abnormal findings: Secondary | ICD-10-CM

## 2021-07-16 NOTE — Progress Notes (Signed)
SUBJECTIVE ? ?HPI ? ?Erica Gibson is a 31 y.o.-year-old female who presents for an annual physical and Pap smear today. She uses IUD for contraception and does not have regular periods. She is currently sexually active with one partner. She denies abnormal bleeding, discharge, or pelvic pain. She had an episode of pelvic pressure recently that lasted approximately a day and a half and then resolved spontaneously. ? ?Medical/Surgical History ?History reviewed. No pertinent past medical history. ?Past Surgical History:  ?Procedure Laterality Date  ? NO PAST SURGERIES    ? ? ?Social History ?Lives with fiance and two children ?Work: Art gallery manager ?Exercise: none at this time ?Substances: denies tobacco, vape, and recreational drugs. Occasional EtOH ? ?Obstetric History ?OB History   ? ? Gravida  ?2  ? Para  ?1  ? Term  ?1  ? Preterm  ?   ? AB  ?   ? Living  ?1  ?  ? ? SAB  ?   ? IAB  ?   ? Ectopic  ?   ? Multiple  ?0  ? Live Births  ?1  ?   ?  ?  ?  ? ?GYN/Menstrual History ?No LMP recorded (lmp unknown). (Menstrual status: IUD). ?Does not get periods with IUD ?Last Pap: unsure ?Contraception: IUD ? ?Prevention ?Endorses regular dental exams ?Encouraged eye exam ?Mammogram: at 34 ? ?Current Medications ?Outpatient Medications Prior to Visit  ?Medication Sig  ? cyanocobalamin (,VITAMIN B-12,) 1000 MCG/ML injection Inject 1 mL (1,000 mcg total) into the muscle every 30 (thirty) days.  ? levonorgestrel (MIRENA) 20 MCG/24HR IUD 1 each by Intrauterine route once.  ? Prenatal Vit-Fe Fumarate-FA (PRENATAL MULTIVITAMIN) TABS tablet Take 1 tablet daily at 12 noon by mouth.  ? phentermine (ADIPEX-P) 37.5 MG tablet Take 1 tablet (37.5 mg total) by mouth daily before breakfast. (Patient not taking: Reported on 06/21/2018)  ? ?No facility-administered medications prior to visit.  ?  ? ? [facility] - 07/16/21 1338   ? ?  ? Pregnancy Intention Screening  ? Does the patient want to become pregnant in the next year? No   ? Does  the patient's partner want to become pregnant in the next year? No   ? Would the patient like to discuss contraceptive options today? No   ?  ? Contraception Wrap Up  ? Current Method IUD or IUS   ? End Method IUD or IUS   ? ?  ?  ? ?  ? ?The pregnancy intention screening data noted above was reviewed. Potential methods of contraception were discussed. The patient elected to proceed with IUD or IUS.  ? ?ROS ?History obtained from the patient ?General ROS: negative for - chills, fatigue, or fever ?Psychological ROS: negative for - anxiety or depression ?Ophthalmic ROS: negative for - blurry vision or decreased vision ?ENT ROS: negative for - headaches, sore throat, or visual changes ?Allergy and Immunology ROS: negative for - nasal congestion or seasonal allergies ?Hematological and Lymphatic ROS: negative for - bleeding problems, swollen lymph nodes, or weight loss ?Endocrine ROS: negative for - breast changes, palpitations, or polydipsia/polyuria ?Breast ROS: positive for - stable lump in breast ?Respiratory ROS: no cough, shortness of breath, or wheezing ?Cardiovascular ROS: no chest pain or dyspnea on exertion ?Gastrointestinal ROS: no abdominal pain, change in bowel habits, or black or bloody stools ?Genito-Urinary ROS: no dysuria, trouble voiding, or hematuria ?negative for - vulvar/vaginal symptoms ?Musculoskeletal ROS: negative for - joint pain or muscle pain ?Dermatological  ROS: negative ? ? ?  12/15/2017  ? 11:05 AM 08/20/2015  ? 10:54 AM 05/05/2015  ?  2:33 PM  ?Depression screen PHQ 2/9  ?Decreased Interest 0 0 0  ?Down, Depressed, Hopeless 0 0 0  ?PHQ - 2 Score 0 0 0  ?Altered sleeping 0    ?Tired, decreased energy 0    ?Change in appetite 0    ?Feeling bad or failure about yourself  0    ?Trouble concentrating 0    ?Moving slowly or fidgety/restless 0    ?PHQ-9 Score 0    ?  ? ?OBJECTIVE ? ?Last Weight  Most recent update: 07/16/2021  1:38 PM  ? ? Weight  ?92.3 kg (203 lb 8 oz)  ?      ? ?  ?  ?Body mass  index is 34.93 kg/m?.  ? ? ?BP 106/75   Pulse 87   Ht 5\' 4"  (1.626 m)   Wt 203 lb 8 oz (92.3 kg)   LMP  (LMP Unknown)   BMI 34.93 kg/m?  ? ?General appearance: alert, cooperative, and appears stated age ?Head: Normocephalic, without obvious abnormality, atraumatic ?Neck: no adenopathy, supple, symmetrical, trachea midline, and thyroid not enlarged, symmetric, no tenderness/mass/nodules ?Lungs: clear to auscultation bilaterally ?Breasts: normal appearance, no masses or tenderness, Inspection negative, Normal to palpation without dominant masses,   ?Heart: regular rate and rhythm, S1, S2 normal, no murmur, click, rub or gallop ?Abdomen: soft, non-tender; bowel sounds normal; no masses,  no organomegaly ?Pelvic: cervix normal in appearance, external genitalia normal, no cervical motion tenderness, rectovaginal septum normal, and vagina normal without discharge ?Extremities: extremities normal, atraumatic, no cyanosis or edema ?Pulses: 2+ and symmetric ?Skin: Skin color, texture, turgor normal. No rashes or lesions ?Lymph nodes: Cervical, supraclavicular, and axillary nodes normal. ? ?ASSESSMENT  ?1) Annual exam ?2) Pap due ? ?PLAN ?1) Physical exam as noted. Labs: CBC, TSH, A1C, lipid profile. Declines STI testing. ?2) Pap collected. F/u based on results. ?3) Encouraged healthy lifestyle choices.  ? ?Return in one year for annual exam or as needed for concerns. ? ? ?Lloyd Huger, CNM  ?

## 2021-07-17 LAB — CBC WITH DIFFERENTIAL/PLATELET
Basophils Absolute: 0 10*3/uL (ref 0.0–0.2)
Basos: 0 %
EOS (ABSOLUTE): 0.1 10*3/uL (ref 0.0–0.4)
Eos: 1 %
Hematocrit: 42.1 % (ref 34.0–46.6)
Hemoglobin: 14.5 g/dL (ref 11.1–15.9)
Immature Grans (Abs): 0 10*3/uL (ref 0.0–0.1)
Immature Granulocytes: 0 %
Lymphocytes Absolute: 1.9 10*3/uL (ref 0.7–3.1)
Lymphs: 31 %
MCH: 30.5 pg (ref 26.6–33.0)
MCHC: 34.4 g/dL (ref 31.5–35.7)
MCV: 88 fL (ref 79–97)
Monocytes Absolute: 0.4 10*3/uL (ref 0.1–0.9)
Monocytes: 7 %
Neutrophils Absolute: 3.8 10*3/uL (ref 1.4–7.0)
Neutrophils: 61 %
Platelets: 232 10*3/uL (ref 150–450)
RBC: 4.76 x10E6/uL (ref 3.77–5.28)
RDW: 11.8 % (ref 11.7–15.4)
WBC: 6.2 10*3/uL (ref 3.4–10.8)

## 2021-07-17 LAB — TSH: TSH: 2.17 u[IU]/mL (ref 0.450–4.500)

## 2021-07-17 LAB — LIPID PANEL
Chol/HDL Ratio: 3 ratio (ref 0.0–4.4)
Cholesterol, Total: 191 mg/dL (ref 100–199)
HDL: 64 mg/dL (ref >39)
LDL Chol Calc (NIH): 116 mg/dL — ABNORMAL HIGH (ref 0–99)
Triglycerides: 61 mg/dL (ref 0–149)
VLDL Cholesterol Cal: 11 mg/dL (ref 5–40)

## 2021-07-17 LAB — HEMOGLOBIN A1C
Est. average glucose Bld gHb Est-mCnc: 100 mg/dL
Hgb A1c MFr Bld: 5.1 % (ref 4.8–5.6)

## 2021-07-21 ENCOUNTER — Encounter: Payer: Self-pay | Admitting: Obstetrics

## 2021-07-21 LAB — CYTOLOGY - PAP
Comment: NEGATIVE
Diagnosis: NEGATIVE
High risk HPV: NEGATIVE
